# Patient Record
Sex: Male | Born: 1960 | Race: White | Hispanic: No | Marital: Single | Smoking: Current every day smoker
Health system: Southern US, Community
[De-identification: ages and names within clinical notes are randomized; demographics above are authoritative.]

## PROBLEM LIST (undated history)

## (undated) DIAGNOSIS — I219 Acute myocardial infarction, unspecified: Secondary | ICD-10-CM

## (undated) DIAGNOSIS — K219 Gastro-esophageal reflux disease without esophagitis: Secondary | ICD-10-CM

## (undated) DIAGNOSIS — Z8614 Personal history of Methicillin resistant Staphylococcus aureus infection: Secondary | ICD-10-CM

## (undated) DIAGNOSIS — J449 Chronic obstructive pulmonary disease, unspecified: Secondary | ICD-10-CM

## (undated) DIAGNOSIS — I10 Essential (primary) hypertension: Secondary | ICD-10-CM

## (undated) DIAGNOSIS — K429 Umbilical hernia without obstruction or gangrene: Secondary | ICD-10-CM

## (undated) HISTORY — PX: CORONARY ANGIOPLASTY: SHX604

## (undated) HISTORY — DX: Umbilical hernia without obstruction or gangrene: K42.9

## (undated) HISTORY — DX: Acute myocardial infarction, unspecified: I21.9

---

## 2012-04-21 HISTORY — PX: CORONARY STENT PLACEMENT: SHX1402

## 2018-01-13 ENCOUNTER — Encounter: Payer: Self-pay | Admitting: *Deleted

## 2018-01-19 ENCOUNTER — Encounter: Payer: Self-pay | Admitting: Surgery

## 2018-01-19 ENCOUNTER — Ambulatory Visit (INDEPENDENT_AMBULATORY_CARE_PROVIDER_SITE_OTHER): Payer: No Typology Code available for payment source | Admitting: Surgery

## 2018-01-19 VITALS — BP 122/66 | HR 65 | Temp 98.1°F | Ht 67.0 in | Wt 177.0 lb

## 2018-01-19 DIAGNOSIS — K429 Umbilical hernia without obstruction or gangrene: Secondary | ICD-10-CM | POA: Diagnosis not present

## 2018-01-19 HISTORY — DX: Umbilical hernia without obstruction or gangrene: K42.9

## 2018-01-19 NOTE — Progress Notes (Signed)
Surgical Clinic History and Physical  Referring provider:  Franciso Bend, NP [address] [place], [place] [address]  HISTORY OF PRESENT ILLNESS (HPI):  57 y.o. male presents for evaluation of his umbilical hernia. Patient reports he first noticed it approximately 1 year ago with surgical evaluation recently advised by patient's primary care physician. Patient denies any abdominal pain or discomfort and states his former constipation has resolved with once daily Metamucil. He underwent colonoscopy 2 years ago and was advised to have another in 10 years, denies blood with BM's, and denies straining or difficulty otherwise with urination. 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 Patient otherwise denies abdominal distention, N/V, unintentional weight loss, CP, or SOB.  PAST MEDICAL HISTORY (PMH):  Past Medical History:  Diagnosis Date  . Myocardial infarction (HCC)      PAST SURGICAL HISTORY (PSH):  Past Surgical History:  Procedure Laterality Date  . CORONARY STENT PLACEMENT  2014     MEDICATIONS:  Prior to Admission medications   Medication Sig Start Date End Date Taking? Authorizing Provider  albuterol (PROVENTIL HFA;VENTOLIN HFA) 108 (90 Base) MCG/ACT inhaler INHALE 1 TO 2 PUFFS INTO LUNGS EVERY 4 TO 6 HOURS AS NEEDED 12/25/17  Yes [provider]  ANORO ELLIPTA 62.5-25 MCG/INH AEPB INHALE 1 PUFF BY MOUTH ONCE DAILY 12/25/17  Yes [provider]  aspirin EC 81 MG tablet Take 81 mg by mouth daily.   Yes [provider]  carvedilol (COREG) 3.125 MG tablet Take 3.125 mg by mouth 2 (two) times daily. 12/25/17  Yes [provider]  D3-50 50000 units capsule Take 50,000 Units by mouth once a week. 12/29/17  Yes [provider]  lovastatin (MEVACOR) 20 MG tablet Take 20 mg by mouth daily. 12/25/17  Yes [provider]  omeprazole (PRILOSEC)  20 MG capsule Take 20 mg by mouth daily.   Yes [provider]     ALLERGIES:  Not on File   SOCIAL HISTORY:  Social History   Socioeconomic History  . Marital status: Unknown    Spouse name: Not on file  . Number of children: Not on file  . Years of education: Not on file  . Highest education level: Not on file  Occupational History  . Not on file  Social Needs  . Financial resource strain: Not on file  . Food insecurity:    Worry: Not on file    Inability: Not on file  . Transportation needs:    Medical: Not on file    Non-medical: Not on file  Tobacco Use  . Smoking status: Current Every Day Smoker    Packs/day: 1.00    Years: 40.00    Pack years: 40.00  . Smokeless tobacco: Never Used  Substance and Sexual Activity  . Alcohol use: Never    Frequency: Never  . Drug use: Never  . Sexual activity: Not on file  Lifestyle  . Physical activity:    Days per week: Not on file    Minutes per session: Not on file  . Stress: Not on file  Relationships  . Social connections:    Talks on phone: Not on file    Gets together: Not on file    Attends religious service: Not on file    Active member of club or organization: Not on file    Attends meetings of clubs or organizations: Not on file    Relationship  status: Not on file  . Intimate partner violence:    Fear of current or ex partner: Not on file    Emotionally abused: Not on file    Physically abused: Not on file    Forced sexual activity: Not on file  Other Topics Concern  . Not on file  Social History Narrative  . Not on file    The patient currently resides (home / rehab facility / nursing home): Home The patient normally is (ambulatory / bedbound): Ambulatory  FAMILY HISTORY:  Family History  Problem Relation Age of Onset  . Esophageal cancer Mother   . Kidney disease Mother   . Heart disease Father   . Prostate cancer Brother   . Prostate cancer Maternal Grandfather     Otherwise  negative/non-contributory.  REVIEW OF SYSTEMS:  Constitutional: denies any other weight loss, fever, chills, or sweats  Eyes: denies any other vision changes, history of eye injury  ENT: denies sore throat, hearing problems  Respiratory: denies shortness of breath, wheezing  Cardiovascular: denies chest pain, palpitations  Gastrointestinal: abdominal pain, N/V, and bowel function as per HPI Musculoskeletal: denies any other joint pains or cramps  Skin: Denies any other rashes or skin discolorations Neurological: denies any other headache, dizziness, weakness  Psychiatric: Denies any other depression, anxiety   All other review of systems were otherwise negative   VITAL SIGNS:  BP 122/66   Pulse 65   Temp 98.1 F (36.7 C) (Temporal)   Ht 5\' 7"  (1.702 m)   Wt 177 lb (80.3 kg)   BMI 27.72 kg/m   PHYSICAL EXAM:  Constitutional:  -- Normal body habitus  -- Awake, alert, and oriented x3  Eyes:  -- Pupils equally round and reactive to light  -- No scleral icterus  Ear, nose, throat:  -- No jugular venous distension -- No nasal drainage, bleeding Pulmonary:  -- No crackles  -- Equal breath sounds bilaterally -- Breathing non-labored at rest Cardiovascular:  -- S1, S2 present  -- No pericardial rubs  Gastrointestinal:  -- Abdomen soft, nontender, non-distended, no guarding/rebound  -- Easily reducible completely non-tender to palpation umbilical hernia -- No other abdominal masses appreciated, pulsatile or otherwise  Musculoskeletal and Integumentary:  -- Wounds or skin discoloration: None appreciated -- Extremities: B/L UE and LE FROM, hands and feet warm, no edema  Neurologic:  -- Motor function: Intact and symmetric -- Sensation: Intact and symmetric  Labs: CBC: No results found for: WBC, RBC BMP: No results found for: GLUCOSE, POTASSIUM, CHLORIDE, CO2, BUN, CREATININE, CALCIUM   Imaging studies: No new pertinent imaging studies available for review    Assessment/Plan: 57 y.o. male with asymptomatic easily reducible umbilical hernia, complicated by pertinent comorbidities including CAD s/p PCI with DES for MI and chronic ongoing tobacco abuse (smoking).               - discussed with patient signs and symptoms of hernia incarceration and obstruction             - strategies for manual self-reduction of patient's umbilical hernia also reviewed and discussed  - maintain hydration with high fiber heart healthy diet to reduce/minimize constipation +/- daily stool softener as needed             - all risks, benefits, and alternatives to open repair of umbilical hernia with mesh were discussed with the patient, all of his questions were answered to patient's expressed satisfaction, patient expresses he is not currently interested in having  his umbilical hernia repaired at this time.  - also discussed with patient wearing abdominal binder, particularly with heavy lifting at work             - return to clinic as needed, instructed to call if any questions or concerns  - smoking cessation discussed at length, strongly encouraged  Thank you for the opportunity to participate in this patient's care.  -- Scherrie Gerlach Earlene Plater, MD, RPVI [place]: [facility] General Surgery - Partnering for exceptional care. Office: [phone]

## 2018-01-19 NOTE — Patient Instructions (Signed)
Please call our office if you decide to move forward with surgery.    Umbilical Hernia, Adult A hernia is a bulge of tissue that pushes through an opening between muscles. An umbilical hernia happens in the abdomen, near the belly button (umbilicus). The hernia may contain tissues from the small intestine, large intestine, or fatty tissue covering the intestines (omentum). Umbilical hernias in adults tend to get worse over time, and they require surgical treatment. There are several types of umbilical hernias. You may have:  A hernia located just above or below the umbilicus (indirect hernia). This is the most common type of umbilical hernia in adults.  A hernia that forms through an opening formed by the umbilicus (direct hernia).  A hernia that comes and goes (reducible hernia). A reducible hernia may be visible only when you strain, lift something heavy, or cough. This type of hernia can be pushed back into the abdomen (reduced).  A hernia that traps abdominal tissue inside the hernia (incarcerated hernia). This type of hernia cannot be reduced.  A hernia that cuts off blood flow to the tissues inside the hernia (strangulated hernia). The tissues can start to die if this happens. This type of hernia requires emergency treatment.  What are the causes? An umbilical hernia happens when tissue inside the abdomen presses on a weak area of the abdominal muscles. What increases the risk? You may have a greater risk of this condition if you:  Are obese.  Have had several pregnancies.  Have a buildup of fluid inside your abdomen (ascites).  Have had surgery that weakens the abdominal muscles.  What are the signs or symptoms? The main symptom of this condition is a painless bulge at or near the belly button. A reducible hernia may be visible only when you strain, lift something heavy, or cough. Other symptoms may include:  Dull pain.  A feeling of pressure.  Symptoms of a strangulated  hernia may include:  Pain that gets increasingly worse.  Nausea and vomiting.  Pain when pressing on the hernia.  Skin over the hernia becoming red or purple.  Constipation.  Blood in the stool.  How is this diagnosed? This condition may be diagnosed based on:  A physical exam. You may be asked to cough or strain while standing. These actions increase the pressure inside your abdomen and force the hernia through the opening in your muscles. Your health care provider may try to reduce the hernia by pressing on it.  Your symptoms and medical history.  How is this treated? Surgery is the only treatment for an umbilical hernia. Surgery for a strangulated hernia is done as soon as possible. If you have a small hernia that is not incarcerated, you may need to lose weight before having surgery. Follow these instructions at home:  Lose weight, if told by your health care provider.  Do not try to push the hernia back in.  Watch your hernia for any changes in color or size. Tell your health care provider if any changes occur.  You may need to avoid activities that increase pressure on your hernia.  Do not lift anything that is heavier than 10 lb (4.5 kg) until your health care provider says that this is safe.  Take over-the-counter and prescription medicines only as told by your health care provider.  Keep all follow-up visits as told by your health care provider. This is important. Contact a health care provider if:  Your hernia gets larger.  Your hernia  becomes painful. Get help right away if:  You develop sudden, severe pain near the area of your hernia.  You have pain as well as nausea or vomiting.  You have pain and the skin over your hernia changes color.  You develop a fever. This information is not intended to replace advice given to you by your health care provider. Make sure you discuss any questions you have with your health care provider. Document Released:  09/07/2015 Document Revised: 12/09/2015 Document Reviewed: 09/07/2015 Elsevier Interactive Patient Education  2018 Elsevier Inc.        High-Fiber Diet Fiber, also called dietary fiber, is a type of carbohydrate found in fruits, vegetables, whole grains, and beans. A high-fiber diet can have many health benefits. Your health care provider may recommend a high-fiber diet to help:  Prevent constipation. Fiber can make your bowel movements more regular.  Lower your cholesterol.  Relieve hemorrhoids, uncomplicated diverticulosis, or irritable bowel syndrome.  Prevent overeating as part of a weight-loss plan.  Prevent heart disease, type 2 diabetes, and certain cancers.  What is my plan? The recommended daily intake of fiber includes:  38 grams for men under age 25.  30 grams for men over age 35.  25 grams for women under age 41.  21 grams for women over age 57.  You can get the recommended daily intake of dietary fiber by eating a variety of fruits, vegetables, grains, and beans. Your health care provider may also recommend a fiber supplement if it is not possible to get enough fiber through your diet. What do I need to know about a high-fiber diet?  Fiber supplements have not been widely studied for their effectiveness, so it is better to get fiber through food sources.  Always check the fiber content on thenutrition facts label of any prepackaged food. Look for foods that contain at least 5 grams of fiber per serving.  Ask your dietitian if you have questions about specific foods that are related to your condition, especially if those foods are not listed in the following section.  Increase your daily fiber consumption gradually. Increasing your intake of dietary fiber too quickly may cause bloating, cramping, or gas.  Drink plenty of water. Water helps you to digest fiber. What foods can I eat? Grains Whole-grain breads. Multigrain cereal. Oats and oatmeal. Brown rice.  Barley. Bulgur wheat. Millet. Bran muffins. Popcorn. Rye wafer crackers. Vegetables Sweet potatoes. Spinach. Kale. Artichokes. Cabbage. Broccoli. Green peas. Carrots. Squash. Fruits Berries. Pears. Apples. Oranges. Avocados. Prunes and raisins. Dried figs. Meats and Other Protein Sources Navy, kidney, pinto, and soy beans. Split peas. Lentils. Nuts and seeds. Dairy Fiber-fortified yogurt. Beverages Fiber-fortified soy milk. Fiber-fortified orange juice. Other Fiber bars. The items listed above may not be a complete list of recommended foods or beverages. Contact your dietitian for more options. What foods are not recommended? Grains White bread. Pasta made with refined flour. White rice. Vegetables Fried potatoes. Canned vegetables. Well-cooked vegetables. Fruits Fruit juice. Cooked, strained fruit. Meats and Other Protein Sources Fatty cuts of meat. Fried Environmental education officer or fried fish. Dairy Milk. Yogurt. Cream cheese. Sour cream. Beverages Soft drinks. Other Cakes and pastries. Butter and oils. The items listed above may not be a complete list of foods and beverages to avoid. Contact your dietitian for more information. What are some tips for including high-fiber foods in my diet?  Eat a wide variety of high-fiber foods.  Make sure that half of all grains consumed each day are whole  grains.  Replace breads and cereals made from refined flour or white flour with whole-grain breads and cereals.  Replace white rice with brown rice, bulgur wheat, or millet.  Start the day with a breakfast that is high in fiber, such as a cereal that contains at least 5 grams of fiber per serving.  Use beans in place of meat in soups, salads, or pasta.  Eat high-fiber snacks, such as berries, raw vegetables, nuts, or popcorn. This information is not intended to replace advice given to you by your health care provider. Make sure you discuss any questions you have with your health care  provider. Document Released: 04/07/2005 Document Revised: 09/13/2015 Document Reviewed: 09/20/2013 Elsevier Interactive Patient Education  2018 [employer]. Constipation, Adult Constipation is when a person:  Poops (has a bowel movement) fewer times in a week than normal.  Has a hard time pooping.  Has poop that is dry, hard, or bigger than normal.  Follow these instructions at home: Eating and drinking   Eat foods that have a lot of fiber, such as: ? Fresh fruits and vegetables. ? Whole grains. ? Beans.  Eat less of foods that are high in fat, low in fiber, or overly processed, such as: ? [place] fries. ? Hamburgers. ? Cookies. ? Candy. ? Soda.  Drink enough fluid to keep your pee (urine) clear or pale yellow. General instructions  Exercise regularly or as told by your doctor.  Go to the restroom when you feel like you need to poop. Do not hold it in.  Take over-the-counter and prescription medicines only as told by your doctor. These include any fiber supplements.  Do pelvic floor retraining exercises, such as: ? Doing deep breathing while relaxing your lower belly (abdomen). ? Relaxing your pelvic floor while pooping.  Watch your condition for any changes.  Keep all follow-up visits as told by your doctor. This is important. Contact a doctor if:  You have pain that gets worse.  You have a fever.  You have not pooped for 4 days.  You throw up (vomit).  You are not hungry.  You lose weight.  You are bleeding from the anus.  You have thin, pencil-like poop (stool). Get help right away if:  You have a fever, and your symptoms suddenly get worse.  You leak poop or have blood in your poop.  Your belly feels hard or bigger than normal (is bloated).  You have very bad belly pain.  You feel dizzy or you faint. This information is not intended to replace advice given to you by your health care provider. Make sure you discuss any questions you have  with your health care provider. Document Released: 09/24/2007 Document Revised: 10/26/2015 Document Reviewed: 09/26/2015 Elsevier Interactive Patient Education  2018 Elsevier Inc. Docusate capsules What is this medicine? DOCUSATE (doc CUE sayt) is stool softener. It helps prevent constipation and straining or discomfort associated with hard or dry stools. This medicine may be used for other purposes; ask your health care provider or pharmacist if you have questions. COMMON BRAND NAME(S): Colace, Colace Clear, Correctol, D.O.S., DC, Doc-Q-Lace, DocuLace, Docusoft S, DOK, DOK Extra Strength, Dulcolax, Genasoft, Kao-Tin, Kaopectate Liqui-Gels, Phillips Stool Softener, Stool Softener, Stool Softner DC, Sulfolax, Sur-Q-Lax, Surfak, Uni-Ease What should I tell my health care provider before I take this medicine? They need to know if you have any of these conditions: -nausea or vomiting -severe constipation -stomach pain -sudden change in bowel habit lasting more than 2 weeks -an unusual  or allergic reaction to docusate, other medicines, foods, dyes, or preservatives -pregnant or trying to get pregnant -breast-feeding How should I use this medicine? Take this medicine by mouth with a glass of water. Follow the directions on the label. Take your doses at regular intervals. Do not take your medicine more often than directed. Talk to your pediatrician regarding the use of this medicine in children. While this medicine may be prescribed for children as young as 2 years for selected conditions, precautions do apply. Overdosage: If you think you have taken too much of this medicine contact a poison control center or emergency room at once. NOTE: This medicine is only for you. Do not share this medicine with others. What if I miss a dose? If you miss a dose, take it as soon as you can. If it is almost time for your next dose, take only that dose. Do not take double or extra doses. What may interact with  this medicine? -mineral oil This list may not describe all possible interactions. Give your health care provider a list of all the medicines, herbs, non-prescription drugs, or dietary supplements you use. Also tell them if you smoke, drink alcohol, or use illegal drugs. Some items may interact with your medicine. What should I watch for while using this medicine? Do not use for more than one week without advice from your doctor or health care professional. If your constipation returns, check with your doctor or health care professional. Drink plenty of water while taking this medicine. Drinking water helps decrease constipation. Stop using this medicine and contact your doctor or health care professional if you experience any rectal bleeding or do not have a bowel movement after use. These could be signs of a more serious condition. What side effects may I notice from receiving this medicine? Side effects that you should report to your doctor or health care professional as soon as possible: -allergic reactions like skin rash, itching or hives, swelling of the face, lips, or tongue Side effects that usually do not require medical attention (report to your doctor or health care professional if they continue or are bothersome): -diarrhea -stomach cramps -throat irritation This list may not describe all possible side effects. Call your doctor for medical advice about side effects. You may report side effects to FDA at 1-800-FDA-1088. Where should I keep my medicine? Keep out of the reach of children. Store at room temperature between 15 and 30 degrees C (59 and 86 degrees F). Throw away any unused medicine after the expiration date. NOTE: This sheet is a summary. It may not cover all possible information. If you have questions about this medicine, talk to your doctor, pharmacist, or health care provider.  2018 Elsevier/Gold Standard (2007-07-29 15:56:49)

## 2018-01-27 ENCOUNTER — Emergency Department
Admission: EM | Admit: 2018-01-27 | Discharge: 2018-01-27 | Disposition: A | Discharge status: Home / Self Care | Payer: No Typology Code available for payment source | Attending: Student in an Organized Health Care Education/Training Program | Admitting: Student in an Organized Health Care Education/Training Program

## 2018-01-27 ENCOUNTER — Other Ambulatory Visit: Payer: Self-pay

## 2018-01-27 ENCOUNTER — Encounter: Payer: Self-pay | Admitting: *Deleted

## 2018-01-27 ENCOUNTER — Telehealth: Payer: Self-pay | Admitting: *Deleted

## 2018-01-27 DIAGNOSIS — F17210 Nicotine dependence, cigarettes, uncomplicated: Secondary | ICD-10-CM | POA: Insufficient documentation

## 2018-01-27 DIAGNOSIS — Z955 Presence of coronary angioplasty implant and graft: Secondary | ICD-10-CM | POA: Diagnosis not present

## 2018-01-27 DIAGNOSIS — R1033 Periumbilical pain: Secondary | ICD-10-CM | POA: Diagnosis present

## 2018-01-27 DIAGNOSIS — I252 Old myocardial infarction: Secondary | ICD-10-CM | POA: Diagnosis not present

## 2018-01-27 DIAGNOSIS — Z79899 Other long term (current) drug therapy: Secondary | ICD-10-CM | POA: Diagnosis not present

## 2018-01-27 DIAGNOSIS — K429 Umbilical hernia without obstruction or gangrene: Secondary | ICD-10-CM | POA: Insufficient documentation

## 2018-01-27 DIAGNOSIS — Z7982 Long term (current) use of aspirin: Secondary | ICD-10-CM | POA: Insufficient documentation

## 2018-01-27 LAB — COMPREHENSIVE METABOLIC PANEL
ALBUMIN: 4.2 g/dL (ref 3.5–5.0)
ALK PHOS: 71 U/L (ref 38–126)
ALT: 17 U/L (ref 0–44)
ANION GAP: 6 (ref 5–15)
AST: 18 U/L (ref 15–41)
BUN: 8 mg/dL (ref 6–20)
CHLORIDE: 103 mmol/L (ref 98–111)
CO2: 30 mmol/L (ref 22–32)
Calcium: 9.3 mg/dL (ref 8.9–10.3)
Creatinine, Ser: 0.86 mg/dL (ref 0.61–1.24)
GFR calc non Af Amer: >60 mL/min (ref >60)
GLUCOSE: 119 mg/dL — AB (ref 70–99)
Potassium: 4.7 mmol/L (ref 3.5–5.1)
SODIUM: 139 mmol/L (ref 135–145)
TOTAL PROTEIN: 7.4 g/dL (ref 6.5–8.1)
Total Bilirubin: 0.5 mg/dL (ref 0.3–1.2)

## 2018-01-27 LAB — URINALYSIS, COMPLETE (UACMP) WITH MICROSCOPIC
Bacteria, UA: NONE SEEN
Bilirubin Urine: NEGATIVE
GLUCOSE, UA: NEGATIVE mg/dL
HGB URINE DIPSTICK: NEGATIVE
KETONES UR: NEGATIVE mg/dL
LEUKOCYTES UA: NEGATIVE
NITRITE: NEGATIVE
PH: 5 (ref 5.0–8.0)
PROTEIN: NEGATIVE mg/dL
Specific Gravity, Urine: 1.017 (ref 1.005–1.030)

## 2018-01-27 LAB — CBC
HCT: 46.3 % (ref 39.0–52.0)
HEMOGLOBIN: 15.5 g/dL (ref 13.0–17.0)
MCH: 29.1 pg (ref 26.0–34.0)
MCHC: 33.5 g/dL (ref 30.0–36.0)
MCV: 87 fL (ref 80.0–100.0)
NRBC: 0 % (ref 0.0–0.2)
PLATELETS: 317 10*3/uL (ref 150–400)
RBC: 5.32 MIL/uL (ref 4.22–5.81)
RDW: 13.5 % (ref 11.5–15.5)
WBC: 9.3 10*3/uL (ref 4.0–10.5)

## 2018-01-27 LAB — LIPASE, BLOOD: LIPASE: 27 U/L (ref 11–51)

## 2018-01-27 NOTE — Telephone Encounter (Signed)
Spoke with patient at this time and he states the hernia is very hard, protruding and very painful. He is currently in route to the emergency room.

## 2018-01-27 NOTE — ED Triage Notes (Signed)
Pt has abd pain  Pt has an umbilical hernia for 1 year.  Pt saw a surgeon last week for the hernia and states he pushed it in and now i'm having pain.  No n/v.  Pt states pain began this am.  Pt alert.

## 2018-01-27 NOTE — ED Provider Notes (Signed)
[facility] Emergency Department Provider Note    First MD Initiated Contact with Patient 01/27/18 1631     (approximate)  I have reviewed the triage vital signs and the nursing notes.   HISTORY  Chief Complaint Abdominal Pain    HPI Mark Cuevas is a 57 y.o. male Modena Jansky with chief complaint of periumbilical pain that was mild to moderate in severity.  Started this morning after he ate.  States he does have a known history of intermittent periumbilical hernia.  Was seen by surgery just recently.  States he was afraid to press on it but while in the waiting room he turned to the left and had sudden resolution of his pain.  Looked down at his bellybutton and the bulge was gone.  No nausea or vomiting.  States he feels "fine" right now.    Past Medical History:  Diagnosis Date  . Myocardial infarction [facility])    Family History  Problem Relation Age of Onset  . Esophageal cancer Mother   . Kidney disease Mother   . Heart disease Father   . Prostate cancer Brother   . Prostate cancer Maternal Grandfather    Past Surgical History:  Procedure Laterality Date  . CORONARY STENT PLACEMENT  2014   Patient Active Problem List   Diagnosis Date Noted  . Umbilical hernia without obstruction and without gangrene 01/19/2018      Prior to Admission medications   Medication Sig Start Date End Date Taking? Authorizing Provider  albuterol (PROVENTIL HFA;VENTOLIN HFA) 108 (90 Base) MCG/ACT inhaler INHALE 1 TO 2 PUFFS INTO LUNGS EVERY 4 TO 6 HOURS AS NEEDED 12/25/17   [provider]  ANORO ELLIPTA 62.5-25 MCG/INH AEPB INHALE 1 PUFF BY MOUTH ONCE DAILY 12/25/17   [provider]  aspirin EC 81 MG tablet Take 81 mg by mouth daily.    [provider]  carvedilol (COREG) 3.125 MG tablet Take 3.125 mg by mouth 2 (two) times daily. 12/25/17   [provider]  D3-50 50000 units capsule Take 50,000 Units by mouth once a week. 12/29/17   [provider]  lovastatin (MEVACOR) 20 MG tablet Take 20 mg by mouth daily. 12/25/17   [provider]  omeprazole (PRILOSEC) 20 MG capsule Take 20 mg by mouth daily.    [provider]    Allergies Patient has no known allergies.    Social History Social History   Tobacco Use  . Smoking status: Current Every Day Smoker    Packs/day: 1.00    Years: 40.00    Pack years: 40.00  . Smokeless tobacco: Never Used  Substance Use Topics  . Alcohol use: Never    Frequency: Never  . Drug use: Never    Review of Systems Patient denies headaches, rhinorrhea, blurry vision, numbness, shortness of breath, chest pain, edema, cough, abdominal pain, nausea, vomiting, diarrhea, dysuria, fevers, rashes or hallucinations unless otherwise stated above in HPI. ____________________________________________   PHYSICAL EXAM:  VITAL SIGNS: Vitals:   01/27/18 1525 01/27/18 1527  BP:  137/75  Pulse: (!) 56   Resp: 20   Temp: 98.7 F (37.1 C)   SpO2: 100%     Constitutional: Alert and oriented.  Eyes: Conjunctivae are normal.  Head: Atraumatic. Nose: No congestion/rhinnorhea. Mouth/Throat: Mucous membranes are moist.   Neck: No stridor. Painless ROM.  Cardiovascular: Normal rate, regular rhythm. Grossly normal heart sounds.  Good peripheral circulation. Respiratory: Normal respiratory effort.  No retractions. Lungs CTAB.  Gastrointestinal: Soft and nontender.  Umbilical defect palpable with small 1 cm reducible hernia.  Mild tenderness but belly soft.  No distention. No abdominal bruits. No CVA tenderness. Genitourinary:  Musculoskeletal: No lower extremity tenderness nor edema.  No joint effusions. Neurologic:  Normal speech and language. No gross focal neurologic deficits are appreciated. No facial droop Skin:  Skin is warm, dry and intact. No rash noted. Psychiatric: Mood and affect are normal. Speech and behavior are  normal.  ____________________________________________   LABS (all labs ordered are listed, but only abnormal results are displayed)  Results for orders placed or performed during the hospital encounter of 01/27/18 (from the past 24 hour(s))  Lipase, blood     Status: None   Collection Time: 01/27/18  3:30 PM  Result Value Ref Range   Lipase 27 11 - 51 U/L  Comprehensive metabolic panel     Status: Abnormal   Collection Time: 01/27/18  3:30 PM  Result Value Ref Range   Sodium 139 135 - 145 mmol/L   Potassium 4.7 3.5 - 5.1 mmol/L   Chloride 103 98 - 111 mmol/L   CO2 30 22 - 32 mmol/L   Glucose, Bld 119 (H) 70 - 99 mg/dL   BUN 8 6 - 20 mg/dL   Creatinine, Ser 1.61 0.61 - 1.24 mg/dL   Calcium 9.3 8.9 - 09.6 mg/dL   Total Protein 7.4 6.5 - 8.1 g/dL   Albumin 4.2 3.5 - 5.0 g/dL   AST 18 15 - 41 U/L   ALT 17 0 - 44 U/L   Alkaline Phosphatase 71 38 - 126 U/L   Total Bilirubin 0.5 0.3 - 1.2 mg/dL   GFR calc non Af Amer >60 >60 mL/min   GFR calc Af Amer >60 >60 mL/min   Anion gap 6 5 - 15  CBC     Status: None   Collection Time: 01/27/18  3:30 PM  Result Value Ref Range   WBC 9.3 4.0 - 10.5 K/uL   RBC 5.32 4.22 - 5.81 MIL/uL   Hemoglobin 15.5 13.0 - 17.0 g/dL   HCT 04.5 40.9 - 81.1 %   MCV 87.0 80.0 - 100.0 fL   MCH 29.1 26.0 - 34.0 pg   MCHC 33.5 30.0 - 36.0 g/dL   RDW 91.4 78.2 - 95.6 %   Platelets 317 150 - 400 K/uL   nRBC 0.0 0.0 - 0.2 %  Urinalysis, Complete w Microscopic     Status: Abnormal   Collection Time: 01/27/18  3:30 PM  Result Value Ref Range   Color, Urine YELLOW (A) YELLOW   APPearance CLEAR (A) CLEAR   Specific Gravity, Urine 1.017 1.005 - 1.030   pH 5.0 5.0 - 8.0   Glucose, UA NEGATIVE NEGATIVE mg/dL   Hgb urine dipstick NEGATIVE NEGATIVE   Bilirubin Urine NEGATIVE NEGATIVE   Ketones, ur NEGATIVE NEGATIVE mg/dL   Protein, ur NEGATIVE NEGATIVE mg/dL   Nitrite NEGATIVE NEGATIVE   Leukocytes, UA NEGATIVE NEGATIVE   RBC / HPF 0-5 0 - 5 RBC/hpf   WBC,  UA 0-5 0 - 5 WBC/hpf   Bacteria, UA NONE SEEN NONE SEEN   Squamous Epithelial / LPF 0-5 0 - 5   Mucus PRESENT    ____________________________________________ ED ECG REPORT I, Willy Eddy, the attending physician, personally viewed and interpreted this ECG.   Date: 01/27/2018  EKG Time: 15:32  Rate: 75  Rhythm: sinus  Axis: normal  Intervals:none  ST&T Change: no stemi  ____________________________________________  RADIOLOGY   ____________________________________________   PROCEDURES  Procedure(s) performed:  Procedures    Critical Care performed: no ____________________________________________   INITIAL IMPRESSION / ASSESSMENT AND PLAN / ED COURSE  Pertinent labs & imaging results that were available during my care of the patient were reviewed by me and considered in my medical decision making (see chart for details).   DDX: Strangulate hernia, incarcerated hernia, reducible hernia  Mark Cuevas is a 57 y.o. who presents to the ED with umbilical hernia status post spontaneous reduction.  His abdominal exam is soft benign.  Blood work is reassuring.  Patient stable and appropriate for outpatient follow-up.      As part of my medical decision making, I reviewed the following data within the electronic MEDICAL RECORD NUMBER Nursing notes reviewed and incorporated, Labs reviewed, notes from prior ED visits and  Controlled Substance Database   ____________________________________________   FINAL CLINICAL IMPRESSION(S) / ED DIAGNOSES  Final diagnoses:  Umbilical hernia without obstruction and without gangrene      NEW MEDICATIONS STARTED DURING THIS VISIT:  New Prescriptions   No medications on file     Note:  This document was prepared using Dragon voice recognition software and may include unintentional dictation errors.    Willy Eddy, MD 01/27/18 [phone]

## 2018-01-27 NOTE — Telephone Encounter (Signed)
Patient called and stated that he was here and saw Dr.Davis for his umbilical hernia. He is now complaining of pain and the pain his causing him to not be able to walk great. He wants to know should he proceed with surgery since its now bothering him

## 2018-02-04 ENCOUNTER — Encounter: Payer: Self-pay | Admitting: Surgery

## 2018-02-04 ENCOUNTER — Ambulatory Visit (INDEPENDENT_AMBULATORY_CARE_PROVIDER_SITE_OTHER): Payer: No Typology Code available for payment source | Admitting: Surgery

## 2018-02-04 ENCOUNTER — Other Ambulatory Visit: Payer: Self-pay

## 2018-02-04 ENCOUNTER — Encounter: Payer: Self-pay | Admitting: *Deleted

## 2018-02-04 VITALS — BP 116/73 | HR 66 | Temp 97.5°F | Resp 16 | Ht 67.0 in | Wt 178.0 lb

## 2018-02-04 DIAGNOSIS — K429 Umbilical hernia without obstruction or gangrene: Secondary | ICD-10-CM | POA: Diagnosis not present

## 2018-02-04 NOTE — Patient Instructions (Signed)
You have requested for your Umbilical Hernia be repaired. This has been scheduled on ____________________ by Dr. Earlene Plater at [facility].  Please see your (blue)pre-care sheet for information.  You will need to arrange to be off work for 1-2 weeks but will have to have a lifting restriction of no more than 15 lbs for 6 weeks following your surgery.   Umbilical Hernia, Adult A hernia is a bulge of tissue that pushes through an opening between muscles. An umbilical hernia happens in the abdomen, near the belly button (umbilicus). The hernia may contain tissues from the small intestine, large intestine, or fatty tissue covering the intestines (omentum). Umbilical hernias in adults tend to get worse over time, and they require surgical treatment. There are several types of umbilical hernias. You may have:  A hernia located just above or below the umbilicus (indirect hernia). This is the most common type of umbilical hernia in adults.  A hernia that forms through an opening formed by the umbilicus (direct hernia).  A hernia that comes and goes (reducible hernia). A reducible hernia may be visible only when you strain, lift something heavy, or cough. This type of hernia can be pushed back into the abdomen (reduced).  A hernia that traps abdominal tissue inside the hernia (incarcerated hernia). This type of hernia cannot be reduced.  A hernia that cuts off blood flow to the tissues inside the hernia (strangulated hernia). The tissues can start to die if this happens. This type of hernia requires emergency treatment.  What are the causes? An umbilical hernia happens when tissue inside the abdomen presses on a weak area of the abdominal muscles. What increases the risk? You may have a greater risk of this condition if you:  Are obese.  Have had several pregnancies.  Have a buildup of fluid inside your abdomen (ascites).  Have had surgery that weakens the abdominal muscles.  What are the  signs or symptoms? The main symptom of this condition is a painless bulge at or near the belly button. A reducible hernia may be visible only when you strain, lift something heavy, or cough. Other symptoms may include:  Dull pain.  A feeling of pressure.  Symptoms of a strangulated hernia may include:  Pain that gets increasingly worse.  Nausea and vomiting.  Pain when pressing on the hernia.  Skin over the hernia becoming red or purple.  Constipation.  Blood in the stool.  How is this diagnosed? This condition may be diagnosed based on:  A physical exam. You may be asked to cough or strain while standing. These actions increase the pressure inside your abdomen and force the hernia through the opening in your muscles. Your health care provider may try to reduce the hernia by pressing on it.  Your symptoms and medical history.  How is this treated? Surgery is the only treatment for an umbilical hernia. Surgery for a strangulated hernia is done as soon as possible. If you have a small hernia that is not incarcerated, you may need to lose weight before having surgery. Follow these instructions at home:  Lose weight, if told by your health care provider.  Do not try to push the hernia back in.  Watch your hernia for any changes in color or size. Tell your health care provider if any changes occur.  You may need to avoid activities that increase pressure on your hernia.  Do not lift anything that is heavier than 10 lb (4.5 kg) until your health care  provider says that this is safe.  Take over-the-counter and prescription medicines only as told by your health care provider.  Keep all follow-up visits as told by your health care provider. This is important. Contact a health care provider if:  Your hernia gets larger.  Your hernia becomes painful. Get help right away if:  You develop sudden, severe pain near the area of your hernia.  You have pain as well as nausea or  vomiting.  You have pain and the skin over your hernia changes color.  You develop a fever. This information is not intended to replace advice given to you by your health care provider. Make sure you discuss any questions you have with your health care provider. Document Released: 09/07/2015 Document Revised: 12/09/2015 Document Reviewed: 09/07/2015 Elsevier Interactive Patient Education  [employer].

## 2018-02-04 NOTE — H&P (View-Only) (Signed)
Surgical Clinic Progress/Follow-up Note   HPI:  57 y.o. Male presents to clinic for follow-up 2 weeks following his recent initial consultation for his umbilical hernia, at which time repair was offered, but declined due to limited symptoms associated with patient's umbilical hernia at that time. Since then, patient presented to [facility] ED 1 week ago for acute onset of umbilical abdominal pain, at which time patient describes he was not able to self-reduce his hernia and presented to [facility] ED accordingly. While in the ED, patient states (confirmed by EMR) that his hernia reduced spontaneously with change in position. Again discussed was patient's constipation, for which he takes only Metamucil without stool softener for hard stools requiring straining. Though patient continues to smoke despite prior MI requiring placement of DES (no longer taking Plavix), patient denies CP or SOB, even with frequent ascending/descending multiple flights of steps per day with much ambulation and similar activities. He also denies N/V.  Review of Systems:  Constitutional: denies any other weight loss, fever, chills, or sweats  Eyes: denies any other vision changes, history of eye injury  ENT: denies sore throat, hearing problems  Respiratory: denies shortness of breath, wheezing  Cardiovascular: denies chest pain, palpitations  Gastrointestinal: abdominal pain, N/V, and bowel function as per HPI Musculoskeletal: denies any other joint pains or cramps  Skin: Denies any other rashes or skin discolorations  Neurological: denies any other headache, dizziness, weakness  Psychiatric: denies any other depression, anxiety  All other review of systems: otherwise negative   Vital Signs:  BP 116/73   Pulse 66   Temp (!) 97.5 F (36.4 C) (Skin)   Resp 16   Ht 5\' 7"  (1.702 m)   Wt 178 lb (80.7 kg)   SpO2 94%   BMI 27.88 kg/m    Physical Exam:  Constitutional:  -- Normal body habitus  -- Awake, alert, and oriented  x3  Eyes:  -- Pupils equally round and reactive to light  -- No scleral icterus  Ear, nose, throat:  -- No jugular venous distension  -- No nasal drainage, bleeding Pulmonary:  -- No crackles -- Equal breath sounds bilaterally -- Breathing non-labored at rest Cardiovascular:  -- S1, S2 present  -- No pericardial rubs  Gastrointestinal:  -- Soft and non-distended with mild-umbilical tenderness to palpation, no guarding/rebound tenderness -- Reducible umbilical hernia with no other abdominal masses appreciated, pulsatile or otherwise  Musculoskeletal / Integumentary:  -- Wounds or skin discoloration: None appreciated  -- Extremities: B/L UE and LE FROM, hands and feet warm, no edema  Neurologic:  -- Motor function: intact and symmetric  -- Sensation: intact and symmetric   Laboratory studies:  CBC Latest Ref Rng & Units 01/27/2018  WBC 4.0 - 10.5 K/uL 9.3  Hemoglobin 13.0 - 17.0 g/dL 08.6  Hematocrit 57.8 - 52.0 % 46.3  Platelets 150 - 400 K/uL 317   CMP Latest Ref Rng & Units 01/27/2018  Glucose 70 - 99 mg/dL 469(G)  BUN 6 - 20 mg/dL 8  Creatinine 2.95 - 2.84 mg/dL 1.32  Sodium 440 - 102 mmol/L 139  Potassium 3.5 - 5.1 mmol/L 4.7  Chloride 98 - 111 mmol/L 103  CO2 22 - 32 mmol/L 30  Calcium 8.9 - 10.3 mg/dL 9.3  Total Protein 6.5 - 8.1 g/dL 7.4  Total Bilirubin 0.3 - 1.2 mg/dL 0.5  Alkaline Phos 38 - 126 U/L 71  AST 15 - 41 U/L 18  ALT 0 - 44 U/L 17   Imaging: No new  pertinent imaging studies available for review   Assessment/Plan: 57 y.o. malewith asymptomatic easily reducible umbilical hernia, complicated by pertinent comorbidities including CAD s/p PCI with DES for MI and chronic ongoing tobacco abuse (smoking).  - again discussed with patient signs and symptoms of hernia incarceration and obstruction - strategies for manual self-reduction of patient's umbilical hernia were also again reviewed and discussed             - add once to twice  daily Colace stool softener to ongoing reported daily Metamucil with adequate hydration and high fiber heart healthy diet to reduce/minimize constipation - all risks, benefits, alternatives to, and anticipated recovery followingopen repair of umbilical hernia with meshwere discussed with patient, all his questions were answered to his expressed satisfaction, patient expresses he wishes to proceed, and informed consent was obtained.             - abdominal binder may provide relief and reduce protrusion of hernia until repair, particularly with heavy lifting at work - return to clinicas needed, instructed to call if any questions or concerns  - anticipate return to clinic 2 weeks following above planned procedure             - smoking cessation discussed at length, strongly encouraged  - instructed to call office if any questions or concerns  Thank you for the opportunity to participate in this patient's care.  -- Scherrie Gerlach Earlene Plater, MD, RPVI [place]: [facility] General Surgery - Partnering for exceptional care. Office: [phone]

## 2018-02-04 NOTE — Progress Notes (Signed)
Patient will require medical clearance per Dr. Earlene Plater before umbilical hernia repair.  Medical clearance request has been faxed to Rexene Agent, NP.

## 2018-02-04 NOTE — Progress Notes (Signed)
Surgical Clinic Progress/Follow-up Note   HPI:  56 y.o. Male presents to clinic for follow-up 2 weeks following his recent initial consultation for his umbilical hernia, at which time repair was offered, but declined due to limited symptoms associated with patient's umbilical hernia at that time. Since then, patient presented to [facility] ED 1 week ago for acute onset of umbilical abdominal pain, at which time patient describes he was not able to self-reduce his hernia and presented to [facility] ED accordingly. While in the ED, patient states (confirmed by EMR) that his hernia reduced spontaneously with change in position. Again discussed was patient's constipation, for which he takes only Metamucil without stool softener for hard stools requiring straining. Though patient continues to smoke despite prior MI requiring placement of DES (no longer taking Plavix), patient denies CP or SOB, even with frequent ascending/descending multiple flights of steps per day with much ambulation and similar activities. He also denies N/V.  Review of Systems:  Constitutional: denies any other weight loss, fever, chills, or sweats  Eyes: denies any other vision changes, history of eye injury  ENT: denies sore throat, hearing problems  Respiratory: denies shortness of breath, wheezing  Cardiovascular: denies chest pain, palpitations  Gastrointestinal: abdominal pain, N/V, and bowel function as per HPI Musculoskeletal: denies any other joint pains or cramps  Skin: Denies any other rashes or skin discolorations  Neurological: denies any other headache, dizziness, weakness  Psychiatric: denies any other depression, anxiety  All other review of systems: otherwise negative   Vital Signs:  BP 116/73   Pulse 66   Temp (!) 97.5 F (36.4 C) (Skin)   Resp 16   Ht 5' 7" (1.702 m)   Wt 178 lb (80.7 kg)   SpO2 94%   BMI 27.88 kg/m    Physical Exam:  Constitutional:  -- Normal body habitus  -- Awake, alert, and oriented  x3  Eyes:  -- Pupils equally round and reactive to light  -- No scleral icterus  Ear, nose, throat:  -- No jugular venous distension  -- No nasal drainage, bleeding Pulmonary:  -- No crackles -- Equal breath sounds bilaterally -- Breathing non-labored at rest Cardiovascular:  -- S1, S2 present  -- No pericardial rubs  Gastrointestinal:  -- Soft and non-distended with mild-umbilical tenderness to palpation, no guarding/rebound tenderness -- Reducible umbilical hernia with no other abdominal masses appreciated, pulsatile or otherwise  Musculoskeletal / Integumentary:  -- Wounds or skin discoloration: None appreciated  -- Extremities: B/L UE and LE FROM, hands and feet warm, no edema  Neurologic:  -- Motor function: intact and symmetric  -- Sensation: intact and symmetric   Laboratory studies:  CBC Latest Ref Rng & Units 01/27/2018  WBC 4.0 - 10.5 K/uL 9.3  Hemoglobin 13.0 - 17.0 g/dL 15.5  Hematocrit 39.0 - 52.0 % 46.3  Platelets 150 - 400 K/uL 317   CMP Latest Ref Rng & Units 01/27/2018  Glucose 70 - 99 mg/dL 119(H)  BUN 6 - 20 mg/dL 8  Creatinine 0.61 - 1.24 mg/dL 0.86  Sodium 135 - 145 mmol/L 139  Potassium 3.5 - 5.1 mmol/L 4.7  Chloride 98 - 111 mmol/L 103  CO2 22 - 32 mmol/L 30  Calcium 8.9 - 10.3 mg/dL 9.3  Total Protein 6.5 - 8.1 g/dL 7.4  Total Bilirubin 0.3 - 1.2 mg/dL 0.5  Alkaline Phos 38 - 126 U/L 71  AST 15 - 41 U/L 18  ALT 0 - 44 U/L 17   Imaging: No new   pertinent imaging studies available for review   Assessment/Plan: 56 y.o. malewith asymptomatic easily reducible umbilical hernia, complicated by pertinent comorbidities including CAD s/p PCI with DES for MI and chronic ongoing tobacco abuse (smoking).  - again discussed with patient signs and symptoms of hernia incarceration and obstruction - strategies for manual self-reduction of patient's umbilical hernia were also again reviewed and discussed             - add once to twice  daily Colace stool softener to ongoing reported daily Metamucil with adequate hydration and high fiber heart healthy diet to reduce/minimize constipation - all risks, benefits, alternatives to, and anticipated recovery followingopen repair of umbilical hernia with meshwere discussed with patient, all his questions were answered to his expressed satisfaction, patient expresses he wishes to proceed, and informed consent was obtained.             - abdominal binder may provide relief and reduce protrusion of hernia until repair, particularly with heavy lifting at work - return to clinicas needed, instructed to call if any questions or concerns  - anticipate return to clinic 2 weeks following above planned procedure             - smoking cessation discussed at length, strongly encouraged  - instructed to call office if any questions or concerns  Thank you for the opportunity to participate in this patient's care.  -- Jason E. Davis, MD, RPVI [place]: [facility] General Surgery - Partnering for exceptional care. Office: [phone] 

## 2018-02-05 ENCOUNTER — Encounter: Payer: Self-pay | Admitting: Surgery

## 2018-02-17 ENCOUNTER — Telehealth: Payer: Self-pay | Admitting: *Deleted

## 2018-02-17 NOTE — Telephone Encounter (Signed)
Patient was contacted today to let him know that we received medical clearance from Rexene Agent, NP.   Patient's surgery to be scheduled for 02-26-18 at [facility] with Dr. Earlene Plater. It is okay for patient to continue an 81 mg aspirin once daily.   The patient is aware to call the office should they have further questions.

## 2018-02-23 ENCOUNTER — Other Ambulatory Visit: Payer: Self-pay

## 2018-02-23 ENCOUNTER — Ambulatory Visit
Admission: RE | Admit: 2018-02-23 | Discharge: 2018-02-23 | Disposition: A | Discharge status: Home / Self Care | Payer: No Typology Code available for payment source | Source: Ambulatory Visit | Attending: Surgery | Admitting: Surgery

## 2018-02-23 ENCOUNTER — Encounter
Admission: RE | Admit: 2018-02-23 | Discharge: 2018-02-23 | Disposition: A | Discharge status: Home / Self Care | Payer: No Typology Code available for payment source | Source: Ambulatory Visit | Attending: Surgery | Admitting: Surgery

## 2018-02-23 DIAGNOSIS — Z01811 Encounter for preprocedural respiratory examination: Secondary | ICD-10-CM | POA: Insufficient documentation

## 2018-02-23 DIAGNOSIS — K429 Umbilical hernia without obstruction or gangrene: Secondary | ICD-10-CM | POA: Diagnosis not present

## 2018-02-23 DIAGNOSIS — Z01818 Encounter for other preprocedural examination: Secondary | ICD-10-CM | POA: Diagnosis present

## 2018-02-23 HISTORY — DX: Chronic obstructive pulmonary disease, unspecified: J44.9

## 2018-02-23 HISTORY — DX: Essential (primary) hypertension: I10

## 2018-02-23 HISTORY — DX: Personal history of Methicillin resistant Staphylococcus aureus infection: Z86.14

## 2018-02-23 HISTORY — DX: Gastro-esophageal reflux disease without esophagitis: K21.9

## 2018-02-23 NOTE — Patient Instructions (Signed)
Your procedure is scheduled on: Friday 02/26/18 Report to Day Surgery. To find out your arrival time please call [phone] between 1PM - 3PM on thurs. 11/7.  Remember: Instructions that are not followed completely may result in serious medical risk,  up to and including death, or upon the discretion of your surgeon and anesthesiologist your  surgery may need to be rescheduled.     _X__ 1. Do not eat food after midnight the night before your procedure.                 No gum chewing or hard candies. You may drink clear liquids up to 2 hours                 before you are scheduled to arrive for your surgery- DO not drink clear                 liquids within 2 hours of the start of your surgery.                 Clear Liquids include:  water, apple juice without pulp, clear carbohydrate                 drink such as Clearfast of Gatorade, Black Coffee or Tea (Do not add                 anything to coffee or tea).  __X__2.  On the morning of surgery brush your teeth with toothpaste and water, you                may rinse your mouth with mouthwash if you wish.  Do not swallow any toothpaste of mouthwash.     _X__ 3.  No Alcohol for 24 hours before or after surgery.   ___ 4.  Do Not Smoke or use e-cigarettes For 24 Hours Prior to Your Surgery.                 Do not use any chewable tobacco products for at least 6 hours prior to                 surgery.  ____  5.  Bring all medications with you on the day of surgery if instructed.   _x___  6.  Notify your doctor if there is any change in your medical condition      (cold, fever, infections).     Do not wear jewelry, make-up, hairpins, clips or nail polish. Do not wear lotions, powders, or perfumes. You may wear deodorant. Do not shave 48 hours prior to surgery. Men may shave face and neck. Do not bring valuables to the hospital.    [facility] is not responsible for any belongings or valuables.  Contacts,  dentures or bridgework may not be worn into surgery. Leave your suitcase in the car. After surgery it may be brought to your room. For patients admitted to the hospital, discharge time is determined by your treatment team.   Patients discharged the day of surgery will not be allowed to drive home.   Please read over the following fact sheets that you were given:    __x__ Take these medicines the morning of surgery with A SIP OF WATER:    1. carvedilol (COREG) 3.125 MG tablet  2. lovastatin (MEVACOR) 20 MG tablet  3. omeprazole (PRILOSEC) 20 MG capsule take extra dose the night before and one the morning of surgery  4.  5.  6.  ____ Fleet Enema (as directed)   __x__ Use CHG Soap as directed  __x__ Use inhalers on the day of surgeryalbuterol (PROVENTIL HFA;VENTOLIN HFA) 108 (90 Base) MCG/ACT inhaler and bring with you            ANORO ELLIPTA 62.5-25 MCG/INH AEPB ____ Stop metformin 2 days prior to surgery    ____ Take 1/2 of usual insulin dose the night before surgery. No insulin the morning          of surgery.   __x__ Stop aspirin today  __x__ Stop Anti-inflammatories No IBUprofeno or Aleve.  Ok to take Tylenol   ____ Stop supplements until after surgery.    ____ Bring C-Pap to the hospital.

## 2018-02-25 MED ORDER — CEFAZOLIN SODIUM-DEXTROSE 2-4 GM/100ML-% IV SOLN
2.0000 g | INTRAVENOUS | Status: AC
  Administered 2018-02-26: 2 g via INTRAVENOUS

## 2018-02-26 ENCOUNTER — Ambulatory Visit
Admission: RE | Admit: 2018-02-26 | Discharge: 2018-02-26 | Disposition: A | Discharge status: Home / Self Care | Payer: No Typology Code available for payment source | Source: Ambulatory Visit | Attending: Surgery | Admitting: Surgery

## 2018-02-26 ENCOUNTER — Ambulatory Visit: Payer: No Typology Code available for payment source | Admitting: Registered Nurse

## 2018-02-26 ENCOUNTER — Encounter
Admission: RE | Disposition: A | Discharge status: Home / Self Care | Payer: Self-pay | Source: Ambulatory Visit | Attending: Surgery

## 2018-02-26 ENCOUNTER — Other Ambulatory Visit: Payer: Self-pay

## 2018-02-26 ENCOUNTER — Encounter: Payer: Self-pay | Admitting: *Deleted

## 2018-02-26 DIAGNOSIS — F17200 Nicotine dependence, unspecified, uncomplicated: Secondary | ICD-10-CM | POA: Diagnosis not present

## 2018-02-26 DIAGNOSIS — K429 Umbilical hernia without obstruction or gangrene: Secondary | ICD-10-CM | POA: Diagnosis not present

## 2018-02-26 DIAGNOSIS — I2510 Atherosclerotic heart disease of native coronary artery without angina pectoris: Secondary | ICD-10-CM | POA: Diagnosis not present

## 2018-02-26 DIAGNOSIS — K5900 Constipation, unspecified: Secondary | ICD-10-CM | POA: Diagnosis not present

## 2018-02-26 DIAGNOSIS — I10 Essential (primary) hypertension: Secondary | ICD-10-CM | POA: Diagnosis not present

## 2018-02-26 DIAGNOSIS — I252 Old myocardial infarction: Secondary | ICD-10-CM | POA: Insufficient documentation

## 2018-02-26 DIAGNOSIS — K420 Umbilical hernia with obstruction, without gangrene: Secondary | ICD-10-CM | POA: Insufficient documentation

## 2018-02-26 DIAGNOSIS — Z79899 Other long term (current) drug therapy: Secondary | ICD-10-CM | POA: Diagnosis not present

## 2018-02-26 DIAGNOSIS — Z955 Presence of coronary angioplasty implant and graft: Secondary | ICD-10-CM | POA: Diagnosis not present

## 2018-02-26 HISTORY — PX: UMBILICAL HERNIA REPAIR: SHX196

## 2018-02-26 SURGERY — REPAIR, HERNIA, UMBILICAL, ADULT
Anesthesia: General

## 2018-02-26 MED ORDER — SUGAMMADEX SODIUM 200 MG/2ML IV SOLN
INTRAVENOUS | Status: AC
  Filled 2018-02-26: qty 2

## 2018-02-26 MED ORDER — PROMETHAZINE HCL 25 MG/ML IJ SOLN: 6.2500 mg | INTRAMUSCULAR | Status: DC | PRN

## 2018-02-26 MED ORDER — SUCCINYLCHOLINE CHLORIDE 20 MG/ML IJ SOLN
INTRAMUSCULAR | Status: AC
  Filled 2018-02-26: qty 1

## 2018-02-26 MED ORDER — CHLORHEXIDINE GLUCONATE CLOTH 2 % EX PADS: 6.0000 | MEDICATED_PAD | Freq: Once | CUTANEOUS | Status: DC

## 2018-02-26 MED ORDER — MIDAZOLAM HCL 2 MG/2ML IJ SOLN
INTRAMUSCULAR | Status: DC | PRN
  Administered 2018-02-26: 2 mg via INTRAVENOUS

## 2018-02-26 MED ORDER — ONDANSETRON HCL 4 MG/2ML IJ SOLN
INTRAMUSCULAR | Status: DC | PRN
  Administered 2018-02-26: 4 mg via INTRAVENOUS

## 2018-02-26 MED ORDER — PROPOFOL 10 MG/ML IV BOLUS
INTRAVENOUS | Status: AC
  Filled 2018-02-26: qty 40

## 2018-02-26 MED ORDER — FENTANYL CITRATE (PF) 100 MCG/2ML IJ SOLN
INTRAMUSCULAR | Status: DC | PRN
  Administered 2018-02-26: 100 ug via INTRAVENOUS
  Administered 2018-02-26: 50 ug via INTRAVENOUS

## 2018-02-26 MED ORDER — ACETAMINOPHEN 500 MG PO TABS
ORAL_TABLET | ORAL | Status: AC
  Filled 2018-02-26: qty 2

## 2018-02-26 MED ORDER — ROCURONIUM BROMIDE 50 MG/5ML IV SOLN
INTRAVENOUS | Status: AC
  Filled 2018-02-26: qty 1

## 2018-02-26 MED ORDER — FENTANYL CITRATE (PF) 100 MCG/2ML IJ SOLN: 25.0000 ug | INTRAMUSCULAR | Status: DC | PRN

## 2018-02-26 MED ORDER — EPHEDRINE SULFATE 50 MG/ML IJ SOLN
INTRAMUSCULAR | Status: DC | PRN
  Administered 2018-02-26: 10 mg via INTRAVENOUS

## 2018-02-26 MED ORDER — DEXAMETHASONE SODIUM PHOSPHATE 10 MG/ML IJ SOLN
INTRAMUSCULAR | Status: DC | PRN
  Administered 2018-02-26: 10 mg via INTRAVENOUS

## 2018-02-26 MED ORDER — ONDANSETRON HCL 4 MG/2ML IJ SOLN
INTRAMUSCULAR | Status: AC
  Filled 2018-02-26: qty 2

## 2018-02-26 MED ORDER — FENTANYL CITRATE (PF) 100 MCG/2ML IJ SOLN
INTRAMUSCULAR | Status: AC
  Filled 2018-02-26: qty 2

## 2018-02-26 MED ORDER — LIDOCAINE HCL (PF) 1 % IJ SOLN
INTRAMUSCULAR | Status: AC
  Filled 2018-02-26: qty 30

## 2018-02-26 MED ORDER — MIDAZOLAM HCL 2 MG/2ML IJ SOLN
INTRAMUSCULAR | Status: AC
  Filled 2018-02-26: qty 2

## 2018-02-26 MED ORDER — SUGAMMADEX SODIUM 200 MG/2ML IV SOLN
INTRAVENOUS | Status: DC | PRN
  Administered 2018-02-26: 200 mg via INTRAVENOUS

## 2018-02-26 MED ORDER — ACETAMINOPHEN 500 MG PO TABS
1000.0000 mg | ORAL_TABLET | ORAL | Status: AC
  Administered 2018-02-26: 1000 mg via ORAL

## 2018-02-26 MED ORDER — BUPIVACAINE HCL (PF) 0.5 % IJ SOLN
INTRAMUSCULAR | Status: AC
  Filled 2018-02-26: qty 30

## 2018-02-26 MED ORDER — PROPOFOL 10 MG/ML IV BOLUS
INTRAVENOUS | Status: DC | PRN
  Administered 2018-02-26: 180 mg via INTRAVENOUS

## 2018-02-26 MED ORDER — GLYCOPYRROLATE 0.2 MG/ML IJ SOLN
INTRAMUSCULAR | Status: AC
  Filled 2018-02-26: qty 2

## 2018-02-26 MED ORDER — EPHEDRINE SULFATE 50 MG/ML IJ SOLN
INTRAMUSCULAR | Status: AC
  Filled 2018-02-26: qty 1

## 2018-02-26 MED ORDER — PHENYLEPHRINE HCL 10 MG/ML IJ SOLN
INTRAMUSCULAR | Status: AC
  Filled 2018-02-26: qty 1

## 2018-02-26 MED ORDER — OXYCODONE-ACETAMINOPHEN 5-325 MG PO TABS: 1.0000 | ORAL_TABLET | ORAL | 0 refills | Status: DC | PRN

## 2018-02-26 MED ORDER — LIDOCAINE HCL 1 % IJ SOLN
INTRAMUSCULAR | Status: DC | PRN
  Administered 2018-02-26: 20 mL via INTRAMUSCULAR

## 2018-02-26 MED ORDER — ROCURONIUM BROMIDE 100 MG/10ML IV SOLN
INTRAVENOUS | Status: DC | PRN
  Administered 2018-02-26: 50 mg via INTRAVENOUS

## 2018-02-26 MED ORDER — CEFAZOLIN SODIUM-DEXTROSE 2-4 GM/100ML-% IV SOLN
INTRAVENOUS | Status: AC
  Filled 2018-02-26: qty 100

## 2018-02-26 MED ORDER — LACTATED RINGERS IV SOLN
INTRAVENOUS | Status: DC
  Administered 2018-02-26 (×2): via INTRAVENOUS

## 2018-02-26 MED ORDER — LIDOCAINE HCL (PF) 2 % IJ SOLN
INTRAMUSCULAR | Status: AC
  Filled 2018-02-26: qty 10

## 2018-02-26 MED ORDER — DEXAMETHASONE SODIUM PHOSPHATE 10 MG/ML IJ SOLN
INTRAMUSCULAR | Status: AC
  Filled 2018-02-26: qty 1

## 2018-02-26 MED ORDER — LIDOCAINE HCL (CARDIAC) PF 100 MG/5ML IV SOSY
PREFILLED_SYRINGE | INTRAVENOUS | Status: DC | PRN
  Administered 2018-02-26: 100 mg via INTRAVENOUS

## 2018-02-26 SURGICAL SUPPLY — 31 items
BLADE CLIPPER SURG (BLADE) ×3 IMPLANT
BLADE SURG 15 STRL LF DISP TIS (BLADE) ×1 IMPLANT
BLADE SURG 15 STRL SS (BLADE) ×2
CHLORAPREP W/TINT 26ML (MISCELLANEOUS) ×3 IMPLANT
COVER WAND RF STERILE (DRAPES) IMPLANT
DECANTER SPIKE VIAL GLASS SM (MISCELLANEOUS) IMPLANT
DERMABOND ADVANCED (GAUZE/BANDAGES/DRESSINGS) ×2
DERMABOND ADVANCED .7 DNX12 (GAUZE/BANDAGES/DRESSINGS) ×1 IMPLANT
DRAIN PENROSE 1/4X12 LTX (DRAIN) IMPLANT
DRAPE LAPAROTOMY 77X122 PED (DRAPES) ×3 IMPLANT
ELECT CAUTERY BLADE 6.4 (BLADE) ×3 IMPLANT
ELECT REM PT RETURN 9FT ADLT (ELECTROSURGICAL) ×3
ELECTRODE REM PT RTRN 9FT ADLT (ELECTROSURGICAL) ×1 IMPLANT
GLOVE BIO SURGEON STRL SZ7 (GLOVE) ×6 IMPLANT
GLOVE BIOGEL PI IND STRL 7.5 (GLOVE) ×2 IMPLANT
GLOVE BIOGEL PI INDICATOR 7.5 (GLOVE) ×4
GOWN STRL REUS W/ TWL LRG LVL3 (GOWN DISPOSABLE) ×1 IMPLANT
GOWN STRL REUS W/TWL LRG LVL3 (GOWN DISPOSABLE) ×2
INSERT FOAM CUSHION (MISCELLANEOUS) IMPLANT
KIT TURNOVER KIT A (KITS) ×3 IMPLANT
MESH VENTRALEX ST 1-7/10 CRC S (Mesh General) ×3 IMPLANT
NEEDLE HYPO 22GX1.5 SAFETY (NEEDLE) ×3 IMPLANT
NS IRRIG 1000ML POUR BTL (IV SOLUTION) ×3 IMPLANT
PACK BASIN MINOR ARMC (MISCELLANEOUS) ×3 IMPLANT
SUT ETHIBOND 0 MO6 C/R (SUTURE) ×3 IMPLANT
SUT MNCRL AB 4-0 PS2 18 (SUTURE) ×3 IMPLANT
SUT VIC AB 2-0 SH 27 (SUTURE) ×2
SUT VIC AB 2-0 SH 27XBRD (SUTURE) ×1 IMPLANT
SUT VIC AB 3-0 SH 27 (SUTURE) ×2
SUT VIC AB 3-0 SH 27X BRD (SUTURE) ×1 IMPLANT
SYR 10ML LL (SYRINGE) ×3 IMPLANT

## 2018-02-26 NOTE — Transfer of Care (Signed)
Immediate Anesthesia Transfer of Care Note  Patient: Mark Cuevas  Procedure(s) Performed: HERNIA REPAIR UMBILICAL ADULT WITH MESH (N/A )  Patient Location: PACU  Anesthesia Type:General  Level of Consciousness: unresponsive  Airway & Oxygen Therapy: Patient Spontanous Breathing and Patient connected to face mask oxygen  Post-op Assessment: Report given to RN and Post -op Vital signs reviewed and stable  Post vital signs: Reviewed and stable  Last Vitals:  Vitals Value Taken Time  BP 119/73 02/26/2018  9:15 AM  Temp 36.9 C 02/26/2018  9:15 AM  Pulse 66 02/26/2018  9:19 AM  Resp 12 02/26/2018  9:19 AM  SpO2 100 % 02/26/2018  9:19 AM  Vitals shown include unvalidated device data.  Last Pain:  Vitals:   02/26/18 0915  TempSrc:   PainSc: Asleep         Complications: No apparent anesthesia complications

## 2018-02-26 NOTE — Interval H&P Note (Signed)
History and Physical Interval Note:  02/26/2018 7:23 AM  Mark Cuevas  has presented today for surgery, with the diagnosis of umbilical hernia  The various methods of treatment have been discussed with the patient and family. After consideration of risks, benefits and other options for treatment, the patient has consented to  Procedure(s): HERNIA REPAIR UMBILICAL ADULT WITH MESH (N/A) as a surgical intervention .  The patient's history has been reviewed, patient examined, no change in status, stable for surgery.  I have reviewed the patient's chart and labs.  Questions were answered to the patient's satisfaction.     Ancil Linsey

## 2018-02-26 NOTE — Anesthesia Preprocedure Evaluation (Signed)
Anesthesia Evaluation  Patient identified by MRN, date of birth, ID band Patient awake    Reviewed: Allergy & Precautions, H&P , NPO status , Patient's Chart, lab work & pertinent test results, reviewed documented beta blocker date and time   History of Anesthesia Complications Negative for: history of anesthetic complications  Airway Mallampati: I  TM Distance: >3 FB Neck ROM: full    Dental  (+) Dental Advidsory Given, Caps, Missing   Pulmonary neg shortness of breath, neg sleep apnea, COPD,  COPD inhaler, neg recent URI, Current Smoker,           Cardiovascular Exercise Tolerance: Good hypertension, (-) angina+ CAD, + Past MI and + Cardiac Stents  (-) CABG (-) dysrhythmias (-) Valvular Problems/Murmurs     Neuro/Psych negative neurological ROS  negative psych ROS   GI/Hepatic Neg liver ROS, GERD  ,  Endo/Other  negative endocrine ROS  Renal/GU negative Renal ROS  negative genitourinary   Musculoskeletal   Abdominal   Peds  Hematology negative hematology ROS (+)   Anesthesia Other Findings Past Medical History: No date: COPD (chronic obstructive pulmonary disease) (HCC) No date: GERD (gastroesophageal reflux disease) No date: History of methicillin resistant staphylococcus aureus (MRSA) No date: Hypertension No date: Myocardial infarction (HCC)     Comment:  2014   Reproductive/Obstetrics negative OB ROS                             Anesthesia Physical Anesthesia Plan  ASA: II  Anesthesia Plan: General   Post-op Pain Management:    Induction: Intravenous  PONV Risk Score and Plan: 1 and Ondansetron, Dexamethasone, Midazolam, Promethazine and Treatment may vary due to age or medical condition  Airway Management Planned: LMA and Oral ETT  Additional Equipment:   Intra-op Plan:   Post-operative Plan: Extubation in OR  Informed Consent: I have reviewed the patients History  and Physical, chart, labs and discussed the procedure including the risks, benefits and alternatives for the proposed anesthesia with the patient or authorized representative who has indicated his/her understanding and acceptance.   Dental Advisory Given  Plan Discussed with: Anesthesiologist, CRNA and Surgeon  Anesthesia Plan Comments:         Anesthesia Quick Evaluation

## 2018-02-26 NOTE — Discharge Instructions (Addendum)
In addition to included general post-operative instructions for Open Repair of Umbilical Hernia with mesh,  Diet: Resume home heart healthy diet.   Activity: No heavy lifting >20 pounds (children, pets, laundry, garbage) or strenuous activity until follow-up, but light activity and walking are encouraged. Do not drive or drink alcohol if taking narcotic pain medications.  Wound care: Remove dressing in 2 days unless otherwise instructed. Once dressing removed, 2 days after surgery (Sunday, 11/10), you may shower/get incision wet with soapy water and pat dry (do not rub incisions), but no baths or submerging incision underwater until follow-up.   Medications: Resume all home medications. For mild to moderate pain: acetaminophen (Tylenol) or ibuprofen/naproxen (if no kidney disease). Combining Tylenol with alcohol can substantially increase your risk of causing liver disease. Narcotic pain medications, if prescribed, can be used for severe pain, though may cause nausea, constipation, and drowsiness. Do not combine Tylenol and Percocet (or similar) within a 6 hour period as Percocet (and similar) contain(s) Tylenol. If you do not need the narcotic pain medication, you do not need to fill the prescription.  Call office [phone]) at any time if any questions, worsening pain, fevers/chills, bleeding, drainage from incision site, or other concerns.   AMBULATORY SURGERY  DISCHARGE INSTRUCTIONS   1) The drugs that you were given will stay in your system until tomorrow so for the next 24 hours you should not:  A) Drive an automobile B) Make any legal decisions C) Drink any alcoholic beverage   2) You may resume regular meals tomorrow.  Today it is better to start with liquids and gradually work up to solid foods.  You may eat anything you prefer, but it is better to start with liquids, then soup and crackers, and gradually work up to solid foods.   3) Please notify your doctor immediately if  you have any unusual bleeding, trouble breathing, redness and pain at the surgery site, drainage, fever, or pain not relieved by medication.    4) Additional Instructions:        Please contact your physician with any problems or Same Day Surgery at [phone], Monday through Friday 6 am to 4 pm, or [place] at [facility] number at [phone].   Open Hernia Repair, Adult, Care After These instructions give you information about caring for yourself after your procedure. Your doctor may also give you more specific instructions. If you have problems or questions, contact your doctor. Follow these instructions at home: Surgical cut (incision) care   Follow instructions from your doctor about how to take care of your surgical cut area. Make sure you: ? Wash your hands with soap and water before you change your bandage (dressing). If you cannot use soap and water, use hand sanitizer. ? Change your bandage as told by your doctor. ? Leave stitches (sutures), skin glue, or skin tape (adhesive) strips in place. They may need to stay in place for 2 weeks or longer. If tape strips get loose and curl up, you may trim the loose edges. Do not remove tape strips completely unless your doctor says it is okay.  Check your surgical cut every day for signs of infection. Check for: ? More redness, swelling, or pain. ? More fluid or blood. ? Warmth. ? Pus or a bad smell. Activity  Do not drive or use heavy machinery while taking prescription pain medicine. Do not drive until your doctor says it is okay.  Until your doctor says it is okay: ? Do  not lift anything that is heavier than 10 lb (4.5 kg). ? Do not play contact sports.  Return to your normal activities as told by your doctor. Ask your doctor what activities are safe. General instructions  To prevent or treat having a hard time pooping (constipation) while you are taking prescription pain medicine, your doctor may recommend that  you: ? Drink enough fluid to keep your pee (urine) clear or pale yellow. ? Take over-the-counter or prescription medicines. ? Eat foods that are high in fiber, such as fresh fruits and vegetables, whole grains, and beans. ? Limit foods that are high in fat and processed sugars, such as fried and sweet foods.  Take over-the-counter and prescription medicines only as told by your doctor.  Do not take baths, swim, or use a hot tub until your doctor says it is okay.  Keep all follow-up visits as told by your doctor. This is important. Contact a doctor if:  You develop a rash.  You have more redness, swelling, or pain around your surgical cut.  You have more fluid or blood coming from your surgical cut.  Your surgical cut feels warm to the touch.  You have pus or a bad smell coming from your surgical cut.  You have a fever or chills.  You have blood in your poop (stool).  You have not pooped in 2-3 days.  Medicine does not help your pain. Get help right away if:  You have chest pain or you are short of breath.  You feel light-headed.  You feel weak and dizzy (feel faint).  You have very bad pain.  You throw up (vomit) and your pain is worse. This information is not intended to replace advice given to you by your health care provider. Make sure you discuss any questions you have with your health care provider. Document Released: 04/28/2014 Document Revised: 10/26/2015 Document Reviewed: 09/19/2015 Elsevier Interactive Patient Education  [employer].

## 2018-02-26 NOTE — Anesthesia Post-op Follow-up Note (Signed)
Anesthesia QCDR form completed.        

## 2018-02-26 NOTE — Anesthesia Procedure Notes (Signed)
Procedure Name: Intubation Date/Time: 02/26/2018 7:44 AM Performed by: Doreen Salvage, CRNA Pre-anesthesia Checklist: Patient identified, Patient being monitored, Timeout performed, Emergency Drugs available and Suction available Patient Re-evaluated:Patient Re-evaluated prior to induction Oxygen Delivery Method: Circle system utilized Preoxygenation: Pre-oxygenation with 100% oxygen Induction Type: IV induction Ventilation: Mask ventilation without difficulty Laryngoscope Size: Mac and 3 Grade View: Grade I Tube type: Oral Tube size: 7.5 mm Number of attempts: 1 Airway Equipment and Method: Stylet Placement Confirmation: ETT inserted through vocal cords under direct vision,  positive ETCO2 and breath sounds checked- equal and bilateral Secured at: 23 cm Tube secured with: Tape Dental Injury: Teeth and Oropharynx as per pre-operative assessment

## 2018-02-26 NOTE — Op Note (Signed)
SURGICAL OPERATIVE REPORT  DATE OF PROCEDURE: 02/26/2018  ATTENDING Surgeon(s): Ancil Linsey, MD  ANESTHESIA: GETA  PRE-OPERATIVE DIAGNOSIS: Increasingly symptomatic (painful) incarcerated umbilical hernia (icd-10: K43.9)  POST-OPERATIVE DIAGNOSIS: Increasingly symptomatic (painful) incarcerated umbilical hernia (icd-10: K43.9)  PROCEDURE(S):  1.) Open repair of increasingly symptomatic (painful) umbilical hernia with mesh (cpt: 16109)  INTRAOPERATIVE FINDINGS: 1.5 cm umbilical hernia with incarcerated and adherent omentum and hernia sac  INTRAVENOUS FLUIDS: 1000 mL crystalloid   ESTIMATED BLOOD LOSS: Minimal (<20 mL)   URINE OUTPUT: No Foley catheter  SPECIMENS: None  IMPLANTS: 4.3 cm Bard Ventralex ST ventral hernia repair mesh  DRAINS: None  COMPLICATIONS: None apparent  CONDITION AT END OF PROCEDURE: Hemodynamically stable and extubated  DISPOSITION OF PATIENT: PACU  INDICATIONS FOR PROCEDURE:  Patient is a 57 y.o. male who presented upon referral from his PMD for evaluation of patient's umbilical hernia. He reported chronic constipation and frequent smoking-associated coughing, though denied abdominal distention, change in bowel function, or N/V and likewise denied straining with urination. He initially declined repair, stating that it didn't bother him enough, and management of his constipation was advised. Shortly thereafter, however, patient presented to [facility] ED for severe umbilical pain associated with incarceration of his hernia. While in the ED, his hernia reduced spontaneously, and he subsequently returned to the office to request repair of his hernia, at which time his hernia was again non-reducible without any signs/symptoms of obstruction. All risks, benefits, and alternatives to above elective procedure were discussed with the patient, all of patient's questions were answered to his expressed satisfaction, and informed consent was obtained and documented  accordingly.  DETAILS OF PROCEDURE: Patient was brought to the operating suite and appropriately identified. General anesthesia was administered along with appropriate pre-operative antibiotics, and endotracheal intubation was performed by anesthetist. In supine position, operative site was prepped and draped in the usual sterile fashion, and following a brief time out, local anesthetic was injected inferior to the umbilicus, and a 3 cm transverse curvilinear incision was made using a #15 blade scalpel and extended deep through subcutaneous tissues using blunt dissection and electrocautery. The hernia sac was then dissected from the undersurface of the umbilical skin and stalk, and the fascial defect was cleared of all omental adhesions. A 4.3 cm Bard Ventralex ST mesh patch was selected, inserted, confirmed to approximate well to fascial edges without intervening viscera or omentum, and patch was secured without tension using Ethibond braided non-absorbable suture, which was also used to re-approximate fascia over secured hernia mesh. The umbilicus was then invaginated using 2-0 Vicryl suture from the dermis to fascia, and the wound was irrigated using sterile saline. Overlying tissues were re-approximated in layers using buried interrupted 3-0 Vicryl suture for dermis. Skin was then cleaned and dried, and sterile Dermabond skin glue was applied and allowed to dry.  Patient was then safely able to be extubated, awakened, and transferred to PACU for post-operative monitoring and care.  I was present for all aspects of the above procedure, and no operative complications were apparent.

## 2018-02-27 NOTE — Anesthesia Postprocedure Evaluation (Signed)
Anesthesia Post Note  Patient: Mark Cuevas  Procedure(s) Performed: HERNIA REPAIR UMBILICAL ADULT WITH MESH (N/A )  Patient location during evaluation: PACU Anesthesia Type: General Level of consciousness: awake and alert Pain management: pain level controlled Vital Signs Assessment: post-procedure vital signs reviewed and stable Respiratory status: spontaneous breathing, nonlabored ventilation, respiratory function stable and patient connected to nasal cannula oxygen Cardiovascular status: blood pressure returned to baseline and stable Postop Assessment: no apparent nausea or vomiting Anesthetic complications: no     Last Vitals:  Vitals:   02/26/18 0954 02/26/18 1001  BP: 122/84 (!) 120/95  Pulse: 69 71  Resp: 18 16  Temp: 36.9 C (!) 36.2 C  SpO2: 94% 96%    Last Pain:  Vitals:   02/26/18 1001  TempSrc: Temporal  PainSc: 4                  Lenard Simmer

## 2018-03-01 ENCOUNTER — Encounter: Payer: Self-pay | Admitting: Surgery

## 2018-03-03 ENCOUNTER — Telehealth: Payer: Self-pay | Admitting: *Deleted

## 2018-03-03 NOTE — Telephone Encounter (Signed)
Patient called and stated that he needs a back to work note. He had surgery on 02/26/18

## 2018-03-03 NOTE — Telephone Encounter (Signed)
Patient states he has already went back to work. Strongly encouraged patient to not be doing any heavy lifting at this time, he agrees. Will fax work note with no heavy lifting over 20 pounds restrictions x 6 weeks.

## 2018-03-03 NOTE — Telephone Encounter (Signed)
Patient is calling and is needing a note to release him to go back to work. Patients fax number [phone] attn: Tammy @ [employer]. Please call patient and advise if any questions.

## 2018-03-11 ENCOUNTER — Encounter: Payer: Self-pay | Admitting: Surgery

## 2018-03-11 ENCOUNTER — Other Ambulatory Visit: Payer: Self-pay

## 2018-03-11 ENCOUNTER — Ambulatory Visit: Payer: No Typology Code available for payment source | Admitting: Surgery

## 2018-03-11 ENCOUNTER — Ambulatory Visit (INDEPENDENT_AMBULATORY_CARE_PROVIDER_SITE_OTHER): Payer: No Typology Code available for payment source | Admitting: Surgery

## 2018-03-11 VITALS — BP 129/76 | HR 71 | Temp 97.9°F | Resp 12 | Ht 67.0 in | Wt 178.0 lb

## 2018-03-11 DIAGNOSIS — Z4889 Encounter for other specified surgical aftercare: Secondary | ICD-10-CM

## 2018-03-11 DIAGNOSIS — K429 Umbilical hernia without obstruction or gangrene: Secondary | ICD-10-CM

## 2018-03-11 NOTE — Patient Instructions (Signed)
Return as needed.The patient is aware to call back for any questions or concerns.  

## 2018-03-11 NOTE — Progress Notes (Signed)
Surgical Clinic Progress/Follow-up Note   HPI:  57 y.o. Male presents to clinic for post-op follow-up 13 Days s/p open repair of increasingly symptomatic incarcerated umbilical hernia with mesh Earlene Plater(Davis, 03/03/2018). Patient reports complete resolution of pre-operative pain and has been tolerating regular diet with +flatus and normal BM's, denies N/V, fever/chills, CP, or SOB. He expresses he is very happy with the results of his hernia repair and has not needed narcotic pain medication, but he does admit he continues to smoke.  Review of Systems:  Constitutional: denies fever/chills  Respiratory: denies shortness of breath, wheezing  Cardiovascular: denies chest pain, palpitations  Gastrointestinal: abdominal pain, N/V, and bowel function as per interval history Skin: Denies any other rashes or skin discolorations except post-surgical wounds as per interval history  Vital Signs:  BP 129/76   Pulse 71   Temp 97.9 F (36.6 C) (Skin)   Resp 12   Ht 5\' 7"  (1.702 m)   Wt 178 lb (80.7 kg)   BMI 27.88 kg/m   Physical Exam:  Constitutional:  -- Normal body habitus  -- Awake, alert, and oriented x3  Pulmonary:  -- No crackles -- Equal breath sounds bilaterally -- Breathing non-labored at rest Cardiovascular:  -- S1, S2 present  -- No pericardial rubs  Gastrointestinal:  -- Soft and non-distended, completely non-tender to palpation, no guarding/rebound tenderness -- Post-surgical incisions all well-approximated without any peri-incisional erythema or drainage -- No abdominal masses appreciated, pulsatile or otherwise  Musculoskeletal / Integumentary:  -- Wounds or skin discoloration: None appreciated except post-surgical incisions as described above (GI) -- Extremities: B/L UE and LE FROM, hands and feet warm, no edema   Imaging: No new pertinent imaging available for review  Assessment:  57 y.o. yo Male with a problem list including...  Patient Active Problem List   Diagnosis  Date Noted  . Umbilical hernia without obstruction and without gangrene 01/19/2018    presents to clinic for post-op follow-up evaluation, doing very well 13 Days s/p open repair of increasingly symptomatic incarcerated umbilical hernia with mesh Earlene Plater(Davis, 03/03/2018).  Plan:              - advance diet as tolerated              - okay to submerge incisions under water (baths, swimming) prn             - no heavy lifting >40 lbs x 4 more weeks, after which may gradually resume activities without restrictions             - apply sunblock particularly to incisions with sun exposure to reduce pigmentation of scars             - return to clinic as needed, instructed to call office if any questions or concerns  - smoking cessation again discussed and encouraged  All of the above recommendations were discussed with the patient, and all of patient's questions were answered to his expressed satisfaction.  -- Scherrie GerlachJason E. Earlene Plateravis, MD, RPVI [place]: [facility] General [facility] - Partnering for exceptional care. Office: [phone]

## 2018-04-08 ENCOUNTER — Ambulatory Visit (INDEPENDENT_AMBULATORY_CARE_PROVIDER_SITE_OTHER): Payer: No Typology Code available for payment source | Admitting: Surgery

## 2018-04-08 ENCOUNTER — Other Ambulatory Visit: Payer: Self-pay

## 2018-04-08 ENCOUNTER — Encounter: Payer: Self-pay | Admitting: Surgery

## 2018-04-08 VITALS — BP 120/76 | HR 73 | Temp 98.1°F | Resp 16 | Ht 67.0 in | Wt 176.0 lb

## 2018-04-08 DIAGNOSIS — Z4889 Encounter for other specified surgical aftercare: Secondary | ICD-10-CM

## 2018-04-08 DIAGNOSIS — T8189XA Other complications of procedures, not elsewhere classified, initial encounter: Secondary | ICD-10-CM

## 2018-04-08 NOTE — Patient Instructions (Addendum)
Please see your follow up appointment listed below.  If the area worsens please let us know.

## 2018-04-08 NOTE — Progress Notes (Signed)
Surgical Clinic Progress/Follow-up Note   HPI:  57 y.o. Male presents to clinic for subsequent post-op follow-up 1 month s/p open repair of his increasingly symptomatic incarcerated umbilical hernia with mesh Earlene Plater(Davis, 03/03/2018). Patient reports some "firmness" around his incision has persisted since his surgery, focally greater at the Right aspect of his otherwise well-healed infra-umbilical post-surgical wound. He says antibiotics were prescribed for him by his primary care physician, which he completed 4 - 5 days ago. The firmness and focal erythema over the Right aspect of his well-healed post-surgical infra-umbilical wound seems to have improved during this time over the past 2 weeks and has not worsened since discontinuation of his antibiotics. He otherwise reports complete resolution of his pre-operative pain/bulge and has been tolerating regular diet with +flatus and normal BM's, denies N/V, fever/chills, CP, or SOB. Of note, patient also proudly reports he filled his prescription for Chantix and is eager and committed to quit smoking.  Review of Systems:  Constitutional: denies fever/chills  Respiratory: denies shortness of breath, wheezing  Cardiovascular: denies chest pain, palpitations  Gastrointestinal: abdominal pain, N/V, and bowel function as per interval history Skin: Denies any other rashes or skin discolorations except post-surgical wounds as per interval history  Vital Signs:  BP 120/76   Pulse 73   Temp 98.1 F (36.7 C) (Temporal)   Resp 16   Ht 5\' 7"  (1.702 m)   Wt 176 lb (79.8 kg)   SpO2 95%   BMI 27.57 kg/m    Physical Exam:  Constitutional:  -- Normal body habitus  -- Awake, alert, and oriented x3  Pulmonary:  -- No crackles -- Equal breath sounds bilaterally -- Breathing non-labored at rest Cardiovascular:  -- S1, S2 present  -- No pericardial rubs  Gastrointestinal:  -- Soft and non-distended with no guarding/rebound tenderness -- Focally greater  firmness at Right aspect of otherwise well-approximated post-surgical infra-umbilical wound than across the rest of the wound with a small amount of minimally tender to palpation focal erythema without any appreciable fluctuance or drainage -- No abdominal masses appreciated, such as recurrent hernia, pulsatile or otherwise  Musculoskeletal / Integumentary:  -- Wounds or skin discoloration: None appreciated except post-surgical incisions as described above (GI) -- Extremities: B/L UE and LE FROM, hands and feet warm, no edema   Imaging: No new pertinent imaging available for review  Assessment:  57 y.o. yo Male with a problem list including...  Patient Active Problem List   Diagnosis Date Noted  . Umbilical hernia without obstruction and without gangrene 01/19/2018    presents to clinic for evaluation of what appears to be post-surgical suture granuloma at the Right aspect of otherwise well-healed infra-umbilical post-surgical wound without any worsening 4 - 5 days after completion of antibiotics prescribed by patient's primary care physician, doing otherwise well 1 month s/p open repair of his increasingly symptomatic incarcerated umbilical hernia with mesh Earlene Plater(Davis, 03/03/2018).  Plan:              - continue current management             - monitor ongoing improvement of post-surgical scar for any signs/symptoms of infection             - return to clinic in 2 weeks to confirm ongoing improvement  - instructed to call office if any questions or concerns  - smoking cessation efforts applauded/encouraged  All of the above recommendations were discussed with the patient, and all of patient's questions were answered to his  expressed satisfaction.  -- Marilynne Drivers Rosana Hoes, MD, [place]: [facility] - Partnering for exceptional care. Office: [phone]

## 2018-04-22 ENCOUNTER — Encounter: Payer: Self-pay | Admitting: Surgery

## 2019-01-25 ENCOUNTER — Encounter: Payer: Self-pay | Admitting: Surgery

## 2019-01-25 ENCOUNTER — Ambulatory Visit (INDEPENDENT_AMBULATORY_CARE_PROVIDER_SITE_OTHER): Payer: No Typology Code available for payment source | Admitting: Surgery

## 2019-01-25 ENCOUNTER — Other Ambulatory Visit: Payer: Self-pay

## 2019-01-25 VITALS — BP 129/78 | HR 68 | Temp 98.1°F | Ht 67.0 in | Wt 152.8 lb

## 2019-01-25 DIAGNOSIS — R109 Unspecified abdominal pain: Secondary | ICD-10-CM

## 2019-01-25 NOTE — Progress Notes (Signed)
01/25/2019  History of Present Illness: Mark Cuevas is a 58 y.o. male status post umbilical hernia repair with Dr. Earlene Plater on 02/26/2018.  Postoperatively, patient did have some issues with firmness at the incision site but this was thought to be related to scar tissue.  He presents today because he has had a couple of episodes of abdominal fullness and tightness going in the middle of the abdomen.  This has happened at different scenarios when he has been active but was uncertain as to why this was happening.  He reports that sometimes the discomfort did cause some shortness of breath.  Otherwise denies any fevers, chills, chest pain.  Denies any nausea or vomiting.  Denies any constipation or diarrhea.  This is only happened 2 or 3 times and last time was about 2 weeks ago.  He almost decided not to come to the appointment today but he figured he would get checked out as a precaution.  Of note, he reports that he still smokes, he has been losing weight and reports that he is losing about 1 pound per week.  He does report that he is very active at his work.  Past Medical History: Past Medical History:  Diagnosis Date  . COPD (chronic obstructive pulmonary disease) (HCC)   . GERD (gastroesophageal reflux disease)   . History of methicillin resistant staphylococcus aureus (MRSA)   . Hypertension   . Myocardial infarction (HCC)    2014  . Umbilical hernia without obstruction and without gangrene 01/19/2018     Past Surgical History: Past Surgical History:  Procedure Laterality Date  . CORONARY ANGIOPLASTY     2 stents  . CORONARY STENT PLACEMENT  2014  . UMBILICAL HERNIA REPAIR N/A 02/26/2018   Procedure: HERNIA REPAIR UMBILICAL ADULT WITH MESH;  Surgeon: Ancil Linsey, MD;  Location: [facility] ORS;  Service: General;  Laterality: N/A;    Home Medications: Prior to Admission medications   Medication Sig Start Date End Date Taking? Authorizing Provider  albuterol (PROVENTIL HFA;VENTOLIN HFA)  108 (90 Base) MCG/ACT inhaler Inhale 1-2 puffs into the lungs every 4 (four) hours as needed for wheezing or shortness of breath.  12/25/17  Yes [provider]  ANORO ELLIPTA 62.5-25 MCG/INH AEPB Inhale 1 puff into the lungs daily as needed (for shortness of breath or wheezing).  12/25/17  Yes [provider]  aspirin EC 81 MG tablet Take 81 mg by mouth daily.   Yes [provider]  carvedilol (COREG) 3.125 MG tablet Take 3.125 mg by mouth 2 (two) times daily. 12/25/17  Yes [provider]  lovastatin (MEVACOR) 20 MG tablet Take 20 mg by mouth daily. 12/25/17  Yes [provider]  omeprazole (PRILOSEC) 20 MG capsule Take 20 mg by mouth daily.   Yes [provider]    Allergies: No Known Allergies  Review of Systems: Review of Systems  Constitutional: Negative for chills and fever.  Respiratory: Negative for shortness of breath.   Cardiovascular: Negative for chest pain.  Gastrointestinal: Positive for abdominal pain. Negative for nausea and vomiting.    Physical Exam BP 129/78   Pulse 68   Temp 98.1 F (36.7 C)   Ht 5\' 7"  (1.702 m)   Wt 152 lb 12.8 oz (69.3 kg)   SpO2 95%   BMI 23.93 kg/m  CONSTITUTIONAL: No acute distress HEENT:  Normocephalic, atraumatic, extraocular motion intact. RESPIRATORY:  Lungs are clear, and breath sounds are equal bilaterally. Normal respiratory effort without pathologic use of  accessory muscles. CARDIOVASCULAR: Heart is regular without murmurs, gallops, or rubs. GI: The abdomen is soft, nondistended, nontender to palpation.  Patient's umbilical incision is well-healed and very faint.  There is no evidence of any recurrent hernia at the umbilical site and there is no evidence of any hernia along the abdominal wall.  NEUROLOGIC:  Motor and sensation is grossly normal.  Cranial nerves are grossly intact. PSYCH:  Alert and oriented to person, place and time. Affect is normal.  Labs/Imaging: None  recently  Assessment and Plan: This is a 58 y.o. male status post umbilical hernia repair with mesh with Dr. Rosana Hoes on 02/26/2018.  - Discussed with the patient that it is unclear why he had these episodes of abdominal discomfort or pulling/tightness.  This could be related to scar tissue that is been developing in the mid abdomen where his hernia repair was done.  At least on exam today there does not appear to be any concerning findings.  No acute surgical needs and at this point I do not see any reason for imaging studies.  Patient will follow-up as needed.  Face-to-face time spent with the patient and care providers was 15 minutes, with more than 50% of the time spent counseling, educating, and coordinating care of the patient.     Mark Cuevas, [facility]

## 2019-01-25 NOTE — Patient Instructions (Addendum)
Follow-up with our office as needed.  Please call and ask to speak with a nurse if you develop questions or concerns.  Please make a follow up appointment with your primary care physician regarding weight loss.     Steps to Quit Smoking Smoking tobacco is the leading cause of preventable death. It can affect almost every organ in the body. Smoking puts you and those around you at risk for developing many serious chronic diseases. Quitting smoking can be difficult, but it is one of the best things that you can do for your health. It is never too late to quit. How do I get ready to quit? When you decide to quit smoking, create a plan to help you succeed. Before you quit:  Pick a date to quit. Set a date within the next 2 weeks to give you time to prepare.  Write down the reasons why you are quitting. Keep this list in places where you will see it often.  Tell your family, friends, and co-workers that you are quitting. Support from your loved ones can make quitting easier.  Talk with your health care provider about your options for quitting smoking.  Find out what treatment options are covered by your health insurance.  Identify people, places, things, and activities that make you want to smoke (triggers). Avoid them. What first steps can I take to quit smoking?  Throw away all cigarettes at home, at work, and in your car.  Throw away smoking accessories, such as Set designer.  Clean your car. Make sure to empty the ashtray.  Clean your home, including curtains and carpets. What strategies can I use to quit smoking? Talk with your health care provider about combining strategies, such as taking medicines while you are also receiving in-person counseling. Using these two strategies together makes you more likely to succeed in quitting than if you used either strategy on its own.  If you are pregnant or breastfeeding, talk with your health care provider about finding counseling  or other support strategies to quit smoking. Do not take medicine to help you quit smoking unless your health care provider tells you to do so. To quit smoking: Quit right away  Quit smoking completely, instead of gradually reducing how much you smoke over a period of time. Research shows that stopping smoking right away is more successful than gradually quitting.  Attend in-person counseling to help you build problem-solving skills. You are more likely to succeed in quitting if you attend counseling sessions regularly. Even short sessions of 10 minutes can be effective. Take medicine You may take medicines to help you quit smoking. Some medicines require a prescription and some you can purchase over-the-counter. Medicines may have nicotine in them to replace the nicotine in cigarettes. Medicines may:  Help to stop cravings.  Help to relieve withdrawal symptoms. Your health care provider may recommend:  Nicotine patches, gum, or lozenges.  Nicotine inhalers or sprays.  Non-nicotine medicine that is taken by mouth. Find resources Find resources and support systems that can help you to quit smoking and remain smoke-free after you quit. These resources are most helpful when you use them often. They include:  Online chats with a Veterinary surgeon.  Telephone quitlines.  Printed Materials engineer.  Support groups or group counseling.  Text messaging programs.  Mobile phone apps or applications. Use apps that can help you stick to your quit plan by providing reminders, tips, and encouragement. There are many free apps for mobile devices as  well as websites. Examples include Quit Guide from the [employer] and smokefree.gov What things can I do to make it easier to quit?   Reach out to your family and friends for support and encouragement. Call telephone quitlines (1-800-QUIT-NOW), reach out to support groups, or work with a counselor for support.  Ask people who smoke to avoid smoking around  you.  Avoid places that trigger you to smoke, such as bars, parties, or smoke-break areas at work.  Spend time with people who do not smoke.  Lessen the stress in your life. Stress can be a smoking trigger for some people. To lessen stress, try: ? Exercising regularly. ? Doing deep-breathing exercises. ? Doing yoga. ? Meditating. ? Performing a body scan. This involves closing your eyes, scanning your body from head to toe, and noticing which parts of your body are particularly tense. Try to relax the muscles in those areas. How will I feel when I quit smoking? Day 1 to 3 weeks Within the first 24 hours of quitting smoking, you may start to feel withdrawal symptoms. These symptoms are usually most noticeable 2-3 days after quitting, but they usually do not last for more than 2-3 weeks. You may experience these symptoms:  Mood swings.  Restlessness, anxiety, or irritability.  Trouble concentrating.  Dizziness.  Strong cravings for sugary foods and nicotine.  Mild weight gain.  Constipation.  Nausea.  Coughing or a sore throat.  Changes in how the medicines that you take for unrelated issues work in your body.  Depression.  Trouble sleeping (insomnia). Week 3 and afterward After the first 2-3 weeks of quitting, you may start to notice more positive results, such as:  Improved sense of smell and taste.  Decreased coughing and sore throat.  Slower heart rate.  Lower blood pressure.  Clearer skin.  The ability to breathe more easily.  Fewer sick days. Quitting smoking can be very challenging. Do not get discouraged if you are not successful the first time. Some people need to make many attempts to quit before they achieve long-term success. Do your best to stick to your quit plan, and talk with your health care provider if you have any questions or concerns. Summary  Smoking tobacco is the leading cause of preventable death. Quitting smoking is one of the best  things that you can do for your health.  When you decide to quit smoking, create a plan to help you succeed.  Quit smoking right away, not slowly over a period of time.  When you start quitting, seek help from your health care provider, family, or friends. This information is not intended to replace advice given to you by your health care provider. Make sure you discuss any questions you have with your health care provider. Document Released: 04/01/2001 Document Revised: 06/25/2018 Document Reviewed: 06/26/2018 Elsevier Patient Education  2020 [employer].

## 2019-09-30 ENCOUNTER — Emergency Department: Payer: 59

## 2019-09-30 ENCOUNTER — Emergency Department
Admission: EM | Admit: 2019-09-30 | Discharge: 2019-10-01 | Disposition: A | Discharge status: Home / Self Care | Payer: 59 | Attending: Emergency Medicine | Admitting: Emergency Medicine

## 2019-09-30 ENCOUNTER — Other Ambulatory Visit: Payer: Self-pay

## 2019-09-30 ENCOUNTER — Encounter: Payer: Self-pay | Admitting: *Deleted

## 2019-09-30 DIAGNOSIS — W208XXA Other cause of strike by thrown, projected or falling object, initial encounter: Secondary | ICD-10-CM | POA: Insufficient documentation

## 2019-09-30 DIAGNOSIS — I10 Essential (primary) hypertension: Secondary | ICD-10-CM | POA: Insufficient documentation

## 2019-09-30 DIAGNOSIS — Y990 Civilian activity done for income or pay: Secondary | ICD-10-CM | POA: Insufficient documentation

## 2019-09-30 DIAGNOSIS — S0990XA Unspecified injury of head, initial encounter: Secondary | ICD-10-CM | POA: Diagnosis not present

## 2019-09-30 DIAGNOSIS — S0181XA Laceration without foreign body of other part of head, initial encounter: Secondary | ICD-10-CM | POA: Diagnosis not present

## 2019-09-30 DIAGNOSIS — Z7982 Long term (current) use of aspirin: Secondary | ICD-10-CM | POA: Insufficient documentation

## 2019-09-30 DIAGNOSIS — S025XXA Fracture of tooth (traumatic), initial encounter for closed fracture: Secondary | ICD-10-CM | POA: Insufficient documentation

## 2019-09-30 DIAGNOSIS — Z79899 Other long term (current) drug therapy: Secondary | ICD-10-CM | POA: Diagnosis not present

## 2019-09-30 DIAGNOSIS — Y939 Activity, unspecified: Secondary | ICD-10-CM | POA: Insufficient documentation

## 2019-09-30 DIAGNOSIS — Y929 Unspecified place or not applicable: Secondary | ICD-10-CM | POA: Insufficient documentation

## 2019-09-30 DIAGNOSIS — J449 Chronic obstructive pulmonary disease, unspecified: Secondary | ICD-10-CM | POA: Diagnosis not present

## 2019-09-30 DIAGNOSIS — F17210 Nicotine dependence, cigarettes, uncomplicated: Secondary | ICD-10-CM | POA: Diagnosis not present

## 2019-09-30 DIAGNOSIS — S0993XA Unspecified injury of face, initial encounter: Secondary | ICD-10-CM

## 2019-09-30 DIAGNOSIS — I252 Old myocardial infarction: Secondary | ICD-10-CM | POA: Diagnosis not present

## 2019-09-30 MED ORDER — MORPHINE SULFATE (PF) 4 MG/ML IV SOLN
4.0000 mg | Freq: Once | INTRAVENOUS | Status: AC
  Administered 2019-09-30: 4 mg via INTRAMUSCULAR
  Filled 2019-09-30: qty 1

## 2019-09-30 MED ORDER — ONDANSETRON HCL 4 MG PO TABS: 4.0000 mg | ORAL_TABLET | Freq: Three times a day (TID) | ORAL | 0 refills | Status: DC | PRN

## 2019-09-30 MED ORDER — ONDANSETRON 4 MG PO TBDP
ORAL_TABLET | ORAL | Status: AC
  Administered 2019-10-01: 4 mg via ORAL
  Filled 2019-09-30: qty 1

## 2019-09-30 MED ORDER — ONDANSETRON 4 MG PO TBDP
4.0000 mg | ORAL_TABLET | Freq: Once | ORAL | Status: AC
  Administered 2019-09-30: 4 mg via ORAL
  Filled 2019-09-30: qty 1

## 2019-09-30 MED ORDER — MORPHINE SULFATE (PF) 4 MG/ML IV SOLN
INTRAVENOUS | Status: AC
  Filled 2019-09-30: qty 1

## 2019-09-30 MED ORDER — HYDROCODONE-ACETAMINOPHEN 5-325 MG PO TABS: 1.0000 | ORAL_TABLET | Freq: Four times a day (QID) | ORAL | 0 refills | Status: DC | PRN

## 2019-09-30 MED ORDER — FLUORESCEIN SODIUM 1 MG OP STRP
ORAL_STRIP | OPHTHALMIC | Status: AC
  Administered 2019-10-01: 1 via OPHTHALMIC
  Filled 2019-09-30: qty 1

## 2019-09-30 NOTE — ED Triage Notes (Signed)
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 Pt denies LOC or blood thinner use. No neurologic deficits noted upon arrival. Pt has multiple teeth missing and left sided front tooth loose upon assessment. 1.5 inch laceration noted to right forehead, two other smaller lacerations to the right forehead. Complicated laceration to the right eyebrow and small laceration to upper lip. Bleeding controlled for all listed lacerations. All wounds cleaned, right eye intact and irrigated. Pt reports slightly blurred vision but vision is intact.

## 2019-09-30 NOTE — ED Notes (Signed)
MD remains at bedside suturing pt.

## 2019-09-30 NOTE — Discharge Instructions (Signed)
Please seek medical attention for any high fevers, chest pain, shortness of breath, change in behavior, persistent vomiting, bloody stool or any other new or concerning symptoms.  

## 2019-09-30 NOTE — ED Provider Notes (Signed)
[facility] Emergency Department Provider Note   ____________________________________________   I have reviewed the triage vital signs and the nursing notes.   HISTORY  Chief Complaint Head Injury   History limited by: Not Limited   HPI Mark Cuevas is a 59 y.o. male who presents to the emergency department today after being injured while at work.  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  It did require and hit him in the right side of his head.  He denies any loss of consciousness.  He denies any double vision.  He states that his right eye is a little blurry however when he blinks it clears up.  He does think his tetanus was in the past 5 years. Denies any other injuries. No cough or shortness of breath.    Records reviewed. Per medical record review patient has a history of COPD, HTN, MI.   Past Medical History:  Diagnosis Date  . COPD (chronic obstructive pulmonary disease) ([place])   . GERD (gastroesophageal reflux disease)   . History of methicillin resistant staphylococcus aureus (MRSA)   . Hypertension   . Myocardial infarction ([place])    2014  . Umbilical hernia without obstruction and without gangrene 01/19/2018    Patient Active Problem List   Diagnosis Date Noted  . Umbilical hernia without obstruction and without gangrene 01/19/2018    Past Surgical History:  Procedure Laterality Date  . CORONARY ANGIOPLASTY     2 stents  . CORONARY STENT PLACEMENT  2014  . UMBILICAL HERNIA REPAIR N/A 02/26/2018   Procedure: HERNIA REPAIR UMBILICAL ADULT WITH MESH;  Surgeon: Vickie Epley, MD;  Location: [facility] ORS;  Service: General;  Laterality: N/A;    Prior to Admission medications   Medication Sig Start Date End Date Taking? Authorizing Provider  albuterol (PROVENTIL HFA;VENTOLIN HFA) 108 (90 Base) MCG/ACT inhaler Inhale 1-2 puffs into the lungs every 4 (four) hours as needed for wheezing or shortness of  breath.  12/25/17   [provider]  ANORO ELLIPTA 62.5-25 MCG/INH AEPB Inhale 1 puff into the lungs daily as needed (for shortness of breath or wheezing).  12/25/17   [provider]  aspirin EC 81 MG tablet Take 81 mg by mouth daily.    [provider]  carvedilol (COREG) 3.125 MG tablet Take 3.125 mg by mouth 2 (two) times daily. 12/25/17   [provider]  lovastatin (MEVACOR) 20 MG tablet Take 20 mg by mouth daily. 12/25/17   [provider]  omeprazole (PRILOSEC) 20 MG capsule Take 20 mg by mouth daily.    [provider]    Allergies Patient has no known allergies.  Family History  Problem Relation Age of Onset  . Esophageal cancer Mother   . Kidney disease Mother   . Heart disease Father   . Prostate cancer Brother   . Prostate cancer Maternal Grandfather     Social History Social History   Tobacco Use  . Smoking status: Current Every Day Smoker    Packs/day: 1.00    Years: 40.00    Pack years: 40.00    Types: Cigarettes  . Smokeless tobacco: Never Used  Vaping Use  . Vaping Use: Never used  Substance Use Topics  . Alcohol use: Never  . Drug use: Never    Review of Systems Constitutional: No fever/chills Eyes: No visual changes. ENT: Positive for broken teeth. Cardiovascular: Denies chest pain. Respiratory: Denies shortness  of breath. Gastrointestinal: No abdominal pain.  No nausea, no vomiting.  No diarrhea.   Genitourinary: Negative for dysuria. Musculoskeletal: Negative for back pain. Skin: Positive for laceration to face Neurological: Positive for headache.  ____________________________________________   PHYSICAL EXAM:  VITAL SIGNS: ED Triage Vitals  Enc Vitals Group     BP 162/84     Pulse 66     Resp 16     Temp 98     Temp src      SpO2 96   Constitutional: Alert and oriented.  Eyes: Right eye conjunctiva with some injection. Fluorescein stain did not show any corneal ulcer or defect ENT       Head: Normocephalic      Nose: No congestion/rhinnorhea.      Mouth/Throat: Multiple fractured teeth. Loose left upper incisor.       Neck: No stridor. No midline tenderness. Hematological/Lymphatic/Immunilogical: No cervical lymphadenopathy. Cardiovascular: Normal rate, regular rhythm.  No murmurs, rubs, or gallops. Respiratory: Normal respiratory effort without tachypnea nor retractions. Breath sounds are clear and equal bilaterally. No wheezes/rales/rhonchi. Gastrointestinal: Soft and non tender. No rebound. No guarding.  Genitourinary: Deferred Musculoskeletal: Normal range of motion in all extremities. No lower extremity edema. Neurologic:  Normal speech and language. No gross focal neurologic deficits are appreciated.  Skin:  Lacerations to right face. Roughly 3 cm vertical laceration to right forehead, two small 1 cm lacerations to right forehead, 2 cm laceration to right eyebrow, 1 cm to right upper lip.  Psychiatric: Mood and affect are normal. Speech and behavior are normal. Patient exhibits appropriate insight and judgment.  ____________________________________________    LABS (pertinent positives/negatives)  None  ____________________________________________   EKG  None  ____________________________________________    RADIOLOGY  CT head/cervical spine/max face No acute intracranial injury. No acute osseous injury. Fractured teeth, and concern for left incisor injury  ____________________________________________   PROCEDURES  Procedures  LACERATION REPAIR Performed by: Phineas Semen Authorized by: Phineas Semen Consent: Verbal consent obtained. Risks and benefits: risks, benefits and alternatives were discussed Consent given by: patient Patient identity confirmed: provided demographic data Prepped and Draped in normal sterile fashion Wound explored  Laceration Location: right face, multiple lacerations  Total Laceration Length: 8 cm  No Foreign  Bodies seen or palpated  Anesthesia: local infiltration  Local anesthetic: bupivacaine 0.5%  Anesthetic total: 5 ml  Irrigation method: syringe Amount of cleaning: standard  Skin closure: 5-0 vicryl rapide  Number of sutures: 25  Technique: simple interrupted  Patient tolerance: Patient tolerated the procedure well with no immediate complications.  ____________________________________________   INITIAL IMPRESSION / ASSESSMENT AND PLAN / ED COURSE  Pertinent labs & imaging results that were available during my care of the patient were reviewed by me and considered in my medical decision making (see chart for details).   Patient presented to the emergency department today because of concerns for injury.  Patient did have a chain snapped back and hit him in the right side of his face.  On exam patient has multiple lacerations to the right face.  Did obtain CT head max face and cervical spine likely none of which showed significant injury.  Patient does state he is up-to-date on his tetanus.  Staining of his right cornea did not show any ulcer or defect.  Patient's lacerations were closed.  Did discuss infection precautions with the patient.  Additionally has multiple fractured teeth.  He states he already has appointment with dentist on Monday.  We also discussed  pain control.  Did discuss concerns for possible addiction with pain medication.  However given multiple dental injuries as well as lacerations patient felt comfortable with small course of narcotic pain meds.  ___________________________________________   FINAL CLINICAL IMPRESSION(S) / ED DIAGNOSES  Final diagnoses:  Injury of head, initial encounter  Dental injury, initial encounter  Facial laceration, initial encounter     Note: This dictation was prepared with Dragon dictation. Any transcriptional errors that result from this process are unintentional     Phineas Semen, MD 09/30/19 2325

## 2019-09-30 NOTE — ED Notes (Signed)
This RN confirmed with pts supervisor with registration at bedside to witness that no testing is needed for pt to file workers comp.

## 2019-10-01 MED ORDER — ONDANSETRON 4 MG PO TBDP: 4.0000 mg | ORAL_TABLET | Freq: Once | ORAL | Status: AC

## 2019-10-01 MED ORDER — BUPIVACAINE HCL 0.5 % IJ SOLN
50.0000 mL | Freq: Once | INTRAMUSCULAR | Status: AC
  Administered 2019-10-01: 50 mL

## 2019-10-01 MED ORDER — TETRACAINE HCL 0.5 % OP SOLN
1.0000 [drp] | Freq: Once | OPHTHALMIC | Status: AC
  Administered 2019-10-01: 2 [drp] via OPHTHALMIC

## 2019-10-01 MED ORDER — FLUORESCEIN SODIUM 1 MG OP STRP: 1.0000 | ORAL_STRIP | Freq: Once | OPHTHALMIC | Status: AC

## 2019-10-17 ENCOUNTER — Ambulatory Visit: Payer: No Typology Code available for payment source | Admitting: Surgery

## 2019-10-19 ENCOUNTER — Other Ambulatory Visit: Payer: Self-pay

## 2019-10-19 ENCOUNTER — Ambulatory Visit (INDEPENDENT_AMBULATORY_CARE_PROVIDER_SITE_OTHER): Payer: No Typology Code available for payment source | Admitting: Surgery

## 2019-10-19 ENCOUNTER — Encounter: Payer: Self-pay | Admitting: Surgery

## 2019-10-19 DIAGNOSIS — R1033 Periumbilical pain: Secondary | ICD-10-CM

## 2019-10-19 NOTE — Patient Instructions (Addendum)
We will get you set up for a CT scan of your abdomen.   You are scheduled for a CT scan at [facility] on Friday July 9th at 8:00 am. You will need to arrive there by 7:45 am and have nothing to eat or drink for 4 hours prior. You will need to pick up a prep kit.   We will have you follow up in 2-3 weeks unless the scan is normal. We will call with your results.

## 2019-10-19 NOTE — Progress Notes (Signed)
10/19/2019  History of Present Illness: Mark Cuevas is a 59 y.o. male following up for abdominal pain.  He is s/p umbilical hernia repair with mesh on 02/26/2018 with Dr. Earlene Plater.  He had some post-op issues with firmness at the incision site, but otherwise did well post-op.  I saw him back in 01/2019 with intermittent episodes of fullness and tightness around the incision depending on his activity level.  His abdominal exam was benign without evidence of hernia recurrence.  Patient now reports that he's starting to feel a ripping sensation at the midline at the umbilicus.  Denies any bulging, but reports that the discomfort has worsened and is worse with activity at work, where he does strenuous activity.  He reports that when he coughs, if he pushes on his abdomen with his hand, he discomfort is less.  Of note, he was recently seen in the ED with multiple facial lacerations due to a work injury, which required multiple stitches.  He is healing well from that.  Past Medical History: Past Medical History:  Diagnosis Date  . COPD (chronic obstructive pulmonary disease) (HCC)   . GERD (gastroesophageal reflux disease)   . History of methicillin resistant staphylococcus aureus (MRSA)   . Hypertension   . Myocardial infarction (HCC)    2014  . Umbilical hernia without obstruction and without gangrene 01/19/2018     Past Surgical History: Past Surgical History:  Procedure Laterality Date  . CORONARY ANGIOPLASTY     2 stents  . CORONARY STENT PLACEMENT  2014  . UMBILICAL HERNIA REPAIR N/A 02/26/2018   Procedure: HERNIA REPAIR UMBILICAL ADULT WITH MESH;  Surgeon: Ancil Linsey, MD;  Location: [facility] ORS;  Service: General;  Laterality: N/A;    Home Medications: Prior to Admission medications   Medication Sig Start Date End Date Taking? Authorizing Provider  albuterol (PROVENTIL HFA;VENTOLIN HFA) 108 (90 Base) MCG/ACT inhaler Inhale 1-2 puffs into the lungs every 4 (four) hours as needed for  wheezing or shortness of breath.  12/25/17  Yes [provider]  aspirin EC 81 MG tablet Take 81 mg by mouth daily.   Yes [provider]  carvedilol (COREG) 3.125 MG tablet Take 3.125 mg by mouth 2 (two) times daily. 12/25/17  Yes [provider]  D3-50 1.25 MG (50000 UT) capsule Take 50,000 Units by mouth once a week. 06/14/19  Yes [provider]  pantoprazole (PROTONIX) 40 MG tablet Take 40 mg by mouth daily. 09/09/19  Yes [provider]  rosuvastatin (CRESTOR) 40 MG tablet Take 40 mg by mouth daily. 09/09/19  Yes [provider]  TRELEGY ELLIPTA 100-62.5-25 MCG/INH AEPB Inhale 1 puff into the lungs daily. 09/19/19  Yes [provider]    Allergies: No Known Allergies  Review of Systems: Review of Systems  Constitutional: Negative for chills and fever.  Respiratory: Negative for shortness of breath.   Cardiovascular: Negative for chest pain.  Gastrointestinal: Positive for abdominal pain. Negative for constipation, diarrhea, nausea and vomiting.    Physical Exam There were no vitals taken for this visit. CONSTITUTIONAL: No acute distress HEENT:  Normocephalic, atraumatic, extraocular motion intact. RESPIRATORY:  Lungs are clear, and breath sounds are equal bilaterally. Normal respiratory effort without pathologic use of accessory muscles. CARDIOVASCULAR: Heart is regular without murmurs, gallops, or rubs. GI: The abdomen is soft, non-distended, with some discomfort to deep palpation at the umbilicus.  Incision is well healed.  There is no bulging when the patient tenses his abdominal wall, but  there is a small linear defect palpable at the umbilicus.  Unclear if this is separation of the rectus muscles, vs post-surgical changes.  I do not feel anything bulging, and otherwise there no other issues with the incision area.  NEUROLOGIC:  Motor and sensation is grossly normal.  Cranial nerves are grossly intact. PSYCH:  Alert and  oriented to person, place and time. Affect is normal.   Assessment and Plan: This is a 59 y.o. male s/p umbilical hernia repair with mesh on 02/26/2018, with worsening ripping sensation at the umbilicus.  --Discussed with the patient that I do not feel a true hernia defect or anything bulging, but there is an area of concern on my exam at the midline.  Unclear if that is a hernia, though less likely.  As a precaution and to better evaluate this, will order CT scan abdomen/pelvis.  Patient will follow up afterwards and I will call him with the results.  Patient is in agreement with this plan.  Face-to-face time spent with the patient and care providers was 15 minutes, with more than 50% of the time spent counseling, educating, and coordinating care of the patient.     Howie Ill, MD [facility]

## 2019-10-28 ENCOUNTER — Ambulatory Visit
Admission: RE | Admit: 2019-10-28 | Discharge: 2019-10-28 | Disposition: A | Discharge status: Home / Self Care | Payer: 59 | Source: Ambulatory Visit | Attending: Surgery | Admitting: Surgery

## 2019-10-28 ENCOUNTER — Other Ambulatory Visit: Payer: Self-pay

## 2019-10-28 DIAGNOSIS — R1033 Periumbilical pain: Secondary | ICD-10-CM | POA: Diagnosis present

## 2019-10-28 LAB — POCT I-STAT CREATININE: Creatinine, Ser: 0.7 mg/dL (ref 0.61–1.24)

## 2019-10-28 MED ORDER — IOHEXOL 300 MG/ML  SOLN
85.0000 mL | Freq: Once | INTRAMUSCULAR | Status: AC | PRN
  Administered 2019-10-28: 85 mL via INTRAVENOUS

## 2019-10-31 ENCOUNTER — Telehealth: Payer: Self-pay

## 2019-10-31 DIAGNOSIS — K639 Disease of intestine, unspecified: Secondary | ICD-10-CM

## 2019-10-31 NOTE — Progress Notes (Signed)
CT scan personally viewed and results discussed with patient.  Area of prior umbilical hernia repair appears intact without any evidence of recurrence or other complication.  He does have an area of thickening along the sigmoid colon and along the stomach.  We'll set up GI referral for further evaluation.  Henrene Dodge, MD

## 2019-10-31 NOTE — Telephone Encounter (Signed)
Per Dr.Piscoya OK to cancel patient appointment for 7/14, along with sending referral for [facility] for thickening of sigmoid colon shown on recent CT scan on 07/09. Patient verbalized understanding and has no further questions.

## 2019-10-31 NOTE — Telephone Encounter (Signed)
-----   Message from Henrene Dodge, MD sent at 10/31/2019  4:16 PM EDT ----- Regarding: referral to GI [facility],  I saw this patient on 6/30 and had a CT scan on 7/9.  I called the patient this afternoon and discussed results with him.  The area of the umbilical hernia repair is intact and there is no evidence of any recurrence or complication.  However, the CT scan does show an area of thickening along the sigmoid colon as well as along the stomach.  Can y'all please set up a referral with GI for this?  He's in agreement to see them.  Also, he has an appointment with me on 7/14 that can be cancelled.  He's ok with that as well.   Thanks!  [employer]

## 2019-11-02 ENCOUNTER — Ambulatory Visit: Payer: No Typology Code available for payment source | Admitting: Surgery

## 2019-12-07 ENCOUNTER — Ambulatory Visit: Payer: No Typology Code available for payment source | Admitting: Gastroenterology

## 2019-12-07 ENCOUNTER — Encounter: Payer: Self-pay | Admitting: *Deleted
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
## 2023-04-21 ENCOUNTER — Ambulatory Visit
Admission: EM | Admit: 2023-04-21 | Discharge: 2023-04-21 | Disposition: A | Discharge status: Home / Self Care | Payer: Non-veteran care | Attending: Emergency Medicine | Admitting: Emergency Medicine

## 2023-04-21 DIAGNOSIS — J441 Chronic obstructive pulmonary disease with (acute) exacerbation: Secondary | ICD-10-CM | POA: Diagnosis not present

## 2023-04-21 DIAGNOSIS — J101 Influenza due to other identified influenza virus with other respiratory manifestations: Secondary | ICD-10-CM | POA: Diagnosis not present

## 2023-04-21 LAB — POC COVID19/FLU A&B COMBO
Covid Antigen, POC: NEGATIVE
Influenza A Antigen, POC: POSITIVE — AB
Influenza B Antigen, POC: NEGATIVE

## 2023-04-21 MED ORDER — OSELTAMIVIR PHOSPHATE 75 MG PO CAPS: 75.0000 mg | ORAL_CAPSULE | Freq: Two times a day (BID) | ORAL | 0 refills | Status: AC

## 2023-04-21 MED ORDER — PREDNISONE 20 MG PO TABS: 40.0000 mg | ORAL_TABLET | Freq: Every day | ORAL | 0 refills | Status: AC

## 2023-04-21 MED ORDER — PROMETHAZINE-DM 6.25-15 MG/5ML PO SYRP: 5.0000 mL | ORAL_SOLUTION | Freq: Every evening | ORAL | 0 refills | Status: AC | PRN

## 2023-04-21 MED ORDER — BENZONATATE 100 MG PO CAPS: 100.0000 mg | ORAL_CAPSULE | Freq: Three times a day (TID) | ORAL | 0 refills | Status: AC

## 2023-04-21 NOTE — ED Triage Notes (Signed)
 Patient to Urgent Care with complaints of sinuses, body aches, nasal and chest congestion, chills and sweats, shortness of breath, poor appetite.  Symptoms started Sunday- felt worse yesterday. Denies any known fevers.  Meds: Sudafed/ alker-seltzer plus.

## 2023-04-21 NOTE — Discharge Instructions (Addendum)
 Positive for influenza commonly known as the flu, this is a  virus and should steadily improve in time it can take up to 7 to 10 days before you truly start to see a turnaround however things will get better  Begin Tamiflu  every morning and every evening for 5 days to reduce the amount of virus in the body which should help to minimize symptoms but does not fully take away illness  Begin prednisone  every morning with food for 5 days to open and relax the airway, should settle shortness of breath virus is most likely flaring your COPD  You may use Tessalon  pill every 8 hours as needed for cough, may use cough syrup at bedtime if needed for rest  For wheezing or shortness of breath you may use albuterol inhaler every 4-6 hours as needed    You can take Tylenol  and/or Ibuprofen as needed for fever reduction and pain relief.   For cough: honey 1/2 to 1 teaspoon (you can dilute the honey in water or another fluid).  You can also use guaifenesin and dextromethorphan for cough. You can use a humidifier for chest congestion and cough.  If you don't have a humidifier, you can sit in the bathroom with the hot shower running.      For sore throat: try warm salt water gargles, cepacol lozenges, throat spray, warm tea or water with lemon/honey, popsicles or ice, or OTC cold relief medicine for throat discomfort.   For congestion: take a daily anti-histamine like Zyrtec, Claritin, and a oral decongestant, such as pseudoephedrine.  You can also use Flonase 1-2 sprays in each nostril daily.   It is important to stay hydrated: drink plenty of fluids (water, gatorade/powerade/pedialyte, juices, or teas) to keep your throat moisturized and help further relieve irritation/discomfort.

## 2023-04-21 NOTE — ED Provider Notes (Signed)
 [facility]    CSN: [id number] Arrival date & time: 04/21/23  1041      History   Chief Complaint Chief Complaint  Patient presents with   URI    HPI Odilon Cass is a 62 y.o. male.   Patient presents for evaluation of nasal congestion, rhinorrhea, nonproductive cough, shortness of breath at rest, sore throat, chills and intermittent generalized headache present for 2 days.  Known sick contact.  Poor appetite but able to tolerate some food and liquids.  Has attempted use of Alka-Seltzer and pseudoephedrine.  History of COPD, daily tobacco use.  Past Medical History:  Diagnosis Date   COPD (chronic obstructive pulmonary disease) (HCC)    GERD (gastroesophageal reflux disease)    History of methicillin resistant staphylococcus aureus (MRSA)    Hypertension    Myocardial infarction [facility])    2014   Umbilical hernia without obstruction and without gangrene 01/19/2018    Patient Active Problem List   Diagnosis Date Noted   Umbilical hernia without obstruction and without gangrene 01/19/2018    Past Surgical History:  Procedure Laterality Date   CORONARY ANGIOPLASTY     2 stents   CORONARY STENT PLACEMENT  2014   UMBILICAL HERNIA REPAIR N/A 02/26/2018   Procedure: HERNIA REPAIR UMBILICAL ADULT WITH MESH;  Surgeon: Nicholaus Selinda Birmingham, MD;  Location: [facility] ORS;  Service: General;  Laterality: N/A;       Home Medications    Prior to Admission medications   Medication Sig Start Date End Date Taking? Authorizing Provider  benzonatate  (TESSALON ) 100 MG capsule Take 1 capsule (100 mg total) by mouth every 8 (eight) hours. 04/21/23  Yes Jaely Silman R, NP  oseltamivir  (TAMIFLU ) 75 MG capsule Take 1 capsule (75 mg total) by mouth every 12 (twelve) hours. 04/21/23  Yes Jaquavius Hudler R, NP  predniSONE  (DELTASONE ) 20 MG tablet Take 2 tablets (40 mg total) by mouth daily. 04/21/23  Yes Gennette Shadix, Shelba SAUNDERS, NP  promethazine -dextromethorphan (PROMETHAZINE -DM) 6.25-15  MG/5ML syrup Take 5 mLs by mouth at bedtime as needed for cough. 04/21/23  Yes Brennan Karam R, NP  albuterol (PROVENTIL HFA;VENTOLIN HFA) 108 (90 Base) MCG/ACT inhaler Inhale 1-2 puffs into the lungs every 4 (four) hours as needed for wheezing or shortness of breath.  12/25/17   [provider]  aspirin EC 81 MG tablet Take 81 mg by mouth daily.    [provider]  carvedilol (COREG) 3.125 MG tablet Take 3.125 mg by mouth 2 (two) times daily. 12/25/17   [provider]  D3-50 1.25 MG (50000 UT) capsule Take 50,000 Units by mouth once a week. 06/14/19   [provider]  pantoprazole (PROTONIX) 40 MG tablet Take 40 mg by mouth daily. 09/09/19   [provider]  rosuvastatin (CRESTOR) 40 MG tablet Take 40 mg by mouth daily. 09/09/19   [provider]  TRELEGY ELLIPTA 100-62.5-25 MCG/INH AEPB Inhale 1 puff into the lungs daily. 09/19/19   [provider]    Family History Family History  Problem Relation Age of Onset   Esophageal cancer Mother    Kidney disease Mother    Heart disease Father    Prostate cancer Brother    Prostate cancer Maternal Grandfather     Social History Social History   Tobacco Use   Smoking status: Every Day    Current packs/day: 1.00    Average packs/day: 1 pack/day for 40.0 years (40.0 ttl pk-yrs)    Types: Cigarettes  Smokeless tobacco: Never  Vaping Use   Vaping status: Never Used  Substance Use Topics   Alcohol use: Never   Drug use: Never     Allergies   Patient has no known allergies.   Review of Systems Review of Systems   Physical Exam Triage Vital Signs ED Triage Vitals  Encounter Vitals Group     BP 04/21/23 1241 124/79     Systolic BP Percentile --      Diastolic BP Percentile --      Pulse Rate 04/21/23 1241 91     Resp 04/21/23 1241 18     Temp 04/21/23 1241 98.8 F (37.1 C)     Temp src --      SpO2 04/21/23 1241 95 %     Weight --      Height --      Head  Circumference --      Peak Flow --      Pain Score 04/21/23 1233 8     Pain Loc --      Pain Education --      Exclude from Growth Chart --    No data found.  Updated Vital Signs BP 124/79   Pulse 91   Temp 98.8 F (37.1 C)   Resp 18   SpO2 95%   Visual Acuity Right Eye Distance:   Left Eye Distance:   Bilateral Distance:    Right Eye Near:   Left Eye Near:    Bilateral Near:     Physical Exam Constitutional:      Appearance: Normal appearance.  HENT:     Head: Normocephalic.     Right Ear: Tympanic membrane, ear canal and external ear normal.     Left Ear: Tympanic membrane, ear canal and external ear normal.     Nose: Congestion present. No rhinorrhea.     Mouth/Throat:     Pharynx: Posterior oropharyngeal erythema present. No oropharyngeal exudate.  Eyes:     Extraocular Movements: Extraocular movements intact.  Cardiovascular:     Rate and Rhythm: Normal rate and regular rhythm.     Pulses: Normal pulses.     Heart sounds: Normal heart sounds.  Pulmonary:     Effort: Pulmonary effort is normal.     Comments: Wheezing present to the bilateral upper lobes, lower lobes are clear Musculoskeletal:     Cervical back: Normal range of motion and neck supple.  Neurological:     Mental Status: He is alert and oriented to person, place, and time. Mental status is at baseline.      UC Treatments / Results  Labs (all labs ordered are listed, but only abnormal results are displayed) Labs Reviewed  POC COVID19/FLU A&B COMBO - Abnormal; Notable for the following components:      Result Value   Influenza A Antigen, POC Positive (*)    All other components within normal limits    EKG   Radiology No results found.  Procedures Procedures (including critical care time)  Medications Ordered in UC Medications - No data to display  Initial Impression / Assessment and Plan / UC Course  I have reviewed the triage vital signs and the nursing notes.  Pertinent  labs & imaging results that were available during my care of the patient were reviewed by me and considered in my medical decision making (see chart for details).  Influenza A, COPD exacerbation  Patient is in no signs of distress nor toxic appearing.  Vital  signs are stable.  Low suspicion for pneumonia, pneumothorax or bronchitis and therefore will defer imaging.  Influenza A positive, negative for COVID, discussed findings.  Viral illness most likely flaring COPD as patient is experiencing shortness of breath.  Prescribed Tamiflu , prednisone , Tessalon  and Promethazine  DM.May use additional over-the-counter medications as needed for supportive care.  May follow-up with urgent care as needed if symptoms persist or worsen.  Final Clinical Impressions(s) / UC Diagnoses   Final diagnoses:  Influenza A  COPD exacerbation (HCC)     Discharge Instructions      Positive for influenza commonly known as the flu, this is a  virus and should steadily improve in time it can take up to 7 to 10 days before you truly start to see a turnaround however things will get better  Begin Tamiflu  every morning and every evening for 5 days to reduce the amount of virus in the body which should help to minimize symptoms but does not fully take away illness  Begin prednisone  every morning with food for 5 days to open and relax the airway, should settle shortness of breath virus is most likely flaring your COPD  You may use Tessalon  pill every 8 hours as needed for cough, may use cough syrup at bedtime if needed for rest  For wheezing or shortness of breath you may use albuterol inhaler every 4-6 hours as needed    You can take Tylenol  and/or Ibuprofen as needed for fever reduction and pain relief.   For cough: honey 1/2 to 1 teaspoon (you can dilute the honey in water or another fluid).  You can also use guaifenesin and dextromethorphan for cough. You can use a humidifier for chest congestion and cough.  If you  don't have a humidifier, you can sit in the bathroom with the hot shower running.      For sore throat: try warm salt water gargles, cepacol lozenges, throat spray, warm tea or water with lemon/honey, popsicles or ice, or OTC cold relief medicine for throat discomfort.   For congestion: take a daily anti-histamine like Zyrtec, Claritin, and a oral decongestant, such as pseudoephedrine.  You can also use Flonase 1-2 sprays in each nostril daily.   It is important to stay hydrated: drink plenty of fluids (water, gatorade/powerade/pedialyte, juices, or teas) to keep your throat moisturized and help further relieve irritation/discomfort.    ED Prescriptions     Medication Sig Dispense Auth. Provider   oseltamivir  (TAMIFLU ) 75 MG capsule Take 1 capsule (75 mg total) by mouth every 12 (twelve) hours. 10 capsule Aniruddh Ciavarella R, NP   predniSONE  (DELTASONE ) 20 MG tablet Take 2 tablets (40 mg total) by mouth daily. 10 tablet Katera Rybka R, NP   benzonatate  (TESSALON ) 100 MG capsule Take 1 capsule (100 mg total) by mouth every 8 (eight) hours. 21 capsule Janyth Riera R, NP   promethazine -dextromethorphan (PROMETHAZINE -DM) 6.25-15 MG/5ML syrup Take 5 mLs by mouth at bedtime as needed for cough. 118 mL Reshaun Briseno, Shelba SAUNDERS, NP      PDMP not reviewed this encounter.   Teresa Shelba SAUNDERS, NP 04/21/23 1451

## 2023-06-25 ENCOUNTER — Ambulatory Visit
Admission: EM | Admit: 2023-06-25 | Discharge: 2023-06-25 | Disposition: A | Discharge status: Home / Self Care | Attending: Emergency Medicine | Admitting: Emergency Medicine

## 2023-06-25 ENCOUNTER — Encounter: Payer: Self-pay | Admitting: Emergency Medicine

## 2023-06-25 ENCOUNTER — Ambulatory Visit

## 2023-06-25 ENCOUNTER — Other Ambulatory Visit: Payer: Self-pay

## 2023-06-25 DIAGNOSIS — S6992XA Unspecified injury of left wrist, hand and finger(s), initial encounter: Secondary | ICD-10-CM | POA: Diagnosis not present

## 2023-06-25 NOTE — Discharge Instructions (Signed)
 On x-ray able to visualize a break in the finger therefore I am and able to close your wound today  Have scheduled you appointments for tomorrow at [facility] orthopedics with Dr. Kathaleen Grinder at 10: 00 AM  Has been thoroughly cleansed by nursing staff and covered with a nonadherent dressing, please leave in place until seen tomorrow

## 2023-06-25 NOTE — ED Triage Notes (Addendum)
 Patient presents to [facility] for evaluation of laceration to his right pointer finger at approx 1130 this morning.  He caught it in a pulley, he describes it as "having cut my finger off".  Patient is still leaking in room, but bleeding is minimal with bandaid over it.    Last tetanus - 12/21/2019

## 2023-06-25 NOTE — ED Provider Notes (Signed)
 Mark Cuevas    CSN: [id number] Arrival date & time: 06/25/23  1405      History   Chief Complaint Chief Complaint  Patient presents with   Laceration    HPI Mark Cuevas is a 63 y.o. male.   Patient presents for evaluation of a laceration to the left index finger that occurred this morning around 11:30 AM.  Finger got caught in a pulley as it was turning causing laceration to the finger and smashing the finger.  Bleeding currently.  Has covered with bandage.  Last tetanus 2021.  To fully move the finger.  Denies numbness or tingling.  Past Medical History:  Diagnosis Date   COPD (chronic obstructive pulmonary disease) (HCC)    GERD (gastroesophageal reflux disease)    History of methicillin resistant staphylococcus aureus (MRSA)    Hypertension    Myocardial infarction [facility])    2014   Umbilical hernia without obstruction and without gangrene 01/19/2018    Patient Active Problem List   Diagnosis Date Noted   Umbilical hernia without obstruction and without gangrene 01/19/2018    Past Surgical History:  Procedure Laterality Date   CORONARY ANGIOPLASTY     2 stents   CORONARY STENT PLACEMENT  2014   UMBILICAL HERNIA REPAIR N/A 02/26/2018   Procedure: HERNIA REPAIR UMBILICAL ADULT WITH MESH;  Surgeon: Ancil Linsey, MD;  Location: [facility] ORS;  Service: General;  Laterality: N/A;       Home Medications    Prior to Admission medications   Medication Sig Start Date End Date Taking? Authorizing Provider  albuterol (PROVENTIL HFA;VENTOLIN HFA) 108 (90 Base) MCG/ACT inhaler Inhale 1-2 puffs into the lungs every 4 (four) hours as needed for wheezing or shortness of breath.  12/25/17   [provider]  aspirin EC 81 MG tablet Take 81 mg by mouth daily.    [provider]  benzonatate (TESSALON) 100 MG capsule Take 1 capsule (100 mg total) by mouth every 8 (eight) hours. 04/21/23   [place] Current, Elita Boone, NP  carvedilol (COREG) 3.125 MG tablet Take 3.125  mg by mouth 2 (two) times daily. 12/25/17   [provider]  D3-50 1.25 MG (50000 UT) capsule Take 50,000 Units by mouth once a week. 06/14/19   [provider]  oseltamivir (TAMIFLU) 75 MG capsule Take 1 capsule (75 mg total) by mouth every 12 (twelve) hours. 04/21/23   Amaal Dimartino, Elita Boone, NP  pantoprazole (PROTONIX) 40 MG tablet Take 40 mg by mouth daily. 09/09/19   [provider]  predniSONE (DELTASONE) 20 MG tablet Take 2 tablets (40 mg total) by mouth daily. 04/21/23   Valinda Hoar, NP  promethazine-dextromethorphan (PROMETHAZINE-DM) 6.25-15 MG/5ML syrup Take 5 mLs by mouth at bedtime as needed for cough. 04/21/23   Climmie Cronce, Elita Boone, NP  rosuvastatin (CRESTOR) 40 MG tablet Take 40 mg by mouth daily. 09/09/19   [provider]  TRELEGY ELLIPTA 100-62.5-25 MCG/INH AEPB Inhale 1 puff into the lungs daily. 09/19/19   [provider]    Family History Family History  Problem Relation Age of Onset   Esophageal cancer Mother    Kidney disease Mother    Heart disease Father    Prostate cancer Brother    Prostate cancer Maternal Grandfather     Social History Social History   Tobacco Use   Smoking status: Every Day    Current packs/day: 1.00    Average packs/day: 1 pack/day for 40.0 years (40.0 ttl pk-yrs)  Types: Cigarettes   Smokeless tobacco: Never  Vaping Use   Vaping status: Never Used  Substance Use Topics   Alcohol use: Never   Drug use: Never     Allergies   Patient has no known allergies.   Review of Systems Review of Systems   Physical Exam Triage Vital Signs ED Triage Vitals [06/25/23 1503]  Encounter Vitals Group     BP 135/81     Systolic BP Percentile      Diastolic BP Percentile      Pulse Rate 60     Resp 16     Temp 97.6 F (36.4 C)     Temp Source Temporal     SpO2 96 %     Weight      Height      Head Circumference      Peak Flow      Pain Score      Pain Loc      Pain Education      Exclude  from Growth Chart    No data found.  Updated Vital Signs BP 135/81 (BP Location: Left Arm)   Pulse 60   Temp 97.6 F (36.4 C) (Temporal)   Resp 16   SpO2 96%   Visual Acuity Right Eye Distance:   Left Eye Distance:   Bilateral Distance:    Right Eye Near:   Left Eye Near:    Bilateral Near:     Physical Exam Constitutional:      Appearance: Normal appearance.  Eyes:     Extraocular Movements: Extraocular movements intact.  Pulmonary:     Effort: Pulmonary effort is normal.  Skin:    Comments: Approximately 1.5 x 2 x 2 laceration present to the palmar aspect of the distal phalanx of the left index finger, overlying the DIP joint but no involvement, able to complete range of motion, sensation intact, bleeding  Neurological:     Mental Status: He is alert and oriented to person, place, and time. Mental status is at baseline.      UC Treatments / Results  Labs (all labs ordered are listed, but only abnormal results are displayed) Labs Reviewed - No data to display  EKG   Radiology No results found.  Procedures Procedures (including critical care time)  Medications Ordered in UC Medications - No data to display  Initial Impression / Assessment and Plan / UC Course  I have reviewed the triage vital signs and the nursing notes.  Pertinent labs & imaging results that were available during my care of the patient were reviewed by me and considered in my medical decision making (see chart for details).  Left index finger, initial encounter  X-ray pending, able to visualize fracture, did discuss with radiology group, and formal reading completed to confirm fracture, notified on-call orthopedic doctor at [facility] clinic, schedule follow-up appointment for tomorrow, wound thoroughly cleansed by nursing staff with Betadine and saline and then covered with a nonadherent dressing, patient in agreement with plan Final Clinical Impressions(s) / UC Diagnoses   Final  diagnoses:  Injury of left index finger, initial encounter     Discharge Instructions      On x-ray able to visualize a break in the finger therefore I am and able to close your wound today  Have scheduled you appointments for tomorrow at [facility] orthopedics with Dr. Kathaleen Grinder at 10: 00 AM  Has been thoroughly cleansed by nursing staff and covered with a nonadherent dressing, please  leave in place until seen tomorrow       ED Prescriptions   None    PDMP not reviewed this encounter.   Valinda Hoar, [place] 06/25/23 [phone]

## 2023-12-14 ENCOUNTER — Other Ambulatory Visit: Payer: Self-pay

## 2023-12-14 DIAGNOSIS — M79604 Pain in right leg: Secondary | ICD-10-CM

## 2023-12-21 ENCOUNTER — Emergency Department
Admission: EM | Admit: 2023-12-21 | Discharge: 2023-12-21 | Disposition: A | Discharge status: Home / Self Care | Attending: Emergency Medicine | Admitting: Emergency Medicine

## 2023-12-21 ENCOUNTER — Other Ambulatory Visit: Payer: Self-pay

## 2023-12-21 ENCOUNTER — Emergency Department

## 2023-12-21 DIAGNOSIS — J449 Chronic obstructive pulmonary disease, unspecified: Secondary | ICD-10-CM | POA: Insufficient documentation

## 2023-12-21 DIAGNOSIS — R0789 Other chest pain: Secondary | ICD-10-CM | POA: Diagnosis present

## 2023-12-21 DIAGNOSIS — I10 Essential (primary) hypertension: Secondary | ICD-10-CM | POA: Diagnosis not present

## 2023-12-21 DIAGNOSIS — Z955 Presence of coronary angioplasty implant and graft: Secondary | ICD-10-CM | POA: Insufficient documentation

## 2023-12-21 DIAGNOSIS — I2510 Atherosclerotic heart disease of native coronary artery without angina pectoris: Secondary | ICD-10-CM | POA: Insufficient documentation

## 2023-12-21 DIAGNOSIS — R079 Chest pain, unspecified: Secondary | ICD-10-CM

## 2023-12-21 LAB — BASIC METABOLIC PANEL WITH GFR
Anion gap: 8 (ref 5–15)
BUN: 12 mg/dL (ref 8–23)
CO2: 27 mmol/L (ref 22–32)
Calcium: 8.9 mg/dL (ref 8.9–10.3)
Chloride: 103 mmol/L (ref 98–111)
Creatinine, Ser: 0.95 mg/dL (ref 0.61–1.24)
GFR, Estimated: >60 mL/min (ref >60)
Glucose, Bld: 134 mg/dL — ABNORMAL HIGH (ref 70–99)
Potassium: 4.3 mmol/L (ref 3.5–5.1)
Sodium: 138 mmol/L (ref 135–145)

## 2023-12-21 LAB — TROPONIN I (HIGH SENSITIVITY): Troponin I (High Sensitivity): 3 ng/L (ref <18)

## 2023-12-21 LAB — CBC
HCT: 43.9 % (ref 39.0–52.0)
Hemoglobin: 14.9 g/dL (ref 13.0–17.0)
MCH: 29.8 pg (ref 26.0–34.0)
MCHC: 33.9 g/dL (ref 30.0–36.0)
MCV: 87.8 fL (ref 80.0–100.0)
Platelets: 261 K/uL (ref 150–400)
RBC: 5 MIL/uL (ref 4.22–5.81)
RDW: 13.8 % (ref 11.5–15.5)
WBC: 9 K/uL (ref 4.0–10.5)
nRBC: 0 % (ref 0.0–0.2)

## 2023-12-21 NOTE — ED Provider Notes (Signed)
 [facility] Provider Note    Event Date/Time   First MD Initiated Contact with Patient 12/21/23 1257     (approximate)  History   Chief Complaint: Chest Pain  HPI  Zyheir Daft is a 63 y.o. male with a past medical history of COPD, gastric reflux, hypertension, CAD status post MI in 2014 with 2 stents, follows up at the [place], presents to the emergency department for chest pain.  According to the patient over the last 2 to 3 days he has had a slight discomfort in the left chest, somewhat worse with certain movements of the way he sits.  Denies any worsening with deep breathing no cough or fever.  Given the patient's history of stents previously he wanted to be evaluated as a precaution.  Patient denies any pain currently.  Physical Exam   Triage Vital Signs: ED Triage Vitals  Encounter Vitals Group     BP 12/21/23 1218 128/73     Girls Systolic BP Percentile --      Girls Diastolic BP Percentile --      Boys Systolic BP Percentile --      Boys Diastolic BP Percentile --      Pulse Rate 12/21/23 1218 64     Resp 12/21/23 1218 16     Temp 12/21/23 1218 98.6 F (37 C)     Temp Source 12/21/23 1218 Oral     SpO2 12/21/23 1218 96 %     Weight 12/21/23 1219 160 lb (72.6 kg)     Height --      Head Circumference --      Peak Flow --      Pain Score 12/21/23 1216 4     Pain Loc --      Pain Education --      Exclude from Growth Chart --     Most recent vital signs: Vitals:   12/21/23 1218  BP: 128/73  Pulse: 64  Resp: 16  Temp: 98.6 F (37 C)  SpO2: 96%    General: Awake, no distress.  CV:  Good peripheral perfusion.  Regular rate and rhythm  Resp:  Normal effort.  Equal breath sounds bilaterally.  Abd:  No distention.  Soft, nontender.  No rebound or guarding. Other:  No lower extremity edema.   ED Results / Procedures / Treatments   EKG  I reviewed and interpreted the EKG which appears show normal sinus rhythm at 64 bpm with a narrow  QRS, normal axis, normal intervals, no concerning ST changes.  RADIOLOGY  I have reviewed interpreted chest x-ray images.  No consolidation on my evaluation. Radiology has read the x-ray as mild left lower lobe atelectasis for scarring.   MEDICATIONS ORDERED IN ED: Medications - No data to display   IMPRESSION / MDM / ASSESSMENT AND PLAN / ED COURSE  I reviewed the triage vital signs and the nursing notes.  Patient's presentation is most consistent with acute presentation with potential threat to life or bodily function.  Patient presents the emergency department for chest pain.  States slight intermittent pain to his left chest.  States it occurs mostly when he sits in certain ways.  Denies any chest pain currently.  No pleuritic pain.  Overall the patient appears well.  Reassuring physical exam, reassuring vital signs.  Patient's labs today are normal as well with a normal CBC chemistry and a negative troponin.  Chest x-ray is clear and EKG shows normal results.  Given the  patient's reassuring findings I believe the patient is safe for discharge home with outpatient follow-up.  I urged the patient to follow-up with his doctor at the [place] as he states he has not had any further cardiac history such as catheterizations or a stress test since 2014.  Patient agreeable to plan.  Provided my typical chest pain return precautions.  FINAL CLINICAL IMPRESSION(S) / ED DIAGNOSES   Chest pain  Note:  This document was prepared using Dragon voice recognition software and may include unintentional dictation errors.   Dorothyann Drivers, MD 12/21/23 1306

## 2023-12-21 NOTE — Discharge Instructions (Signed)
 Please follow-up with your doctor at the [facility] regarding your chest pain.  Return to the emergency department should your chest pain returns/worsen, develop any shortness of breath, or any other symptom personally concerning to yourself.

## 2023-12-21 NOTE — ED Triage Notes (Signed)
 Pt to ED via POV from home. Pt reports left sided CP that started Friday and has been intermittent. Pt reports MI in 2014. Denies SOB. Everyday smoker.

## 2024-02-11 ENCOUNTER — Ambulatory Visit

## 2024-05-05 ENCOUNTER — Encounter: Payer: Self-pay | Admitting: Emergency Medicine

## 2024-05-05 ENCOUNTER — Ambulatory Visit: Admission: EM | Admit: 2024-05-05 | Discharge: 2024-05-05 | Disposition: A | Discharge status: Home / Self Care

## 2024-05-05 DIAGNOSIS — M545 Low back pain, unspecified: Secondary | ICD-10-CM

## 2024-05-05 MED ORDER — IBUPROFEN 600 MG PO TABS: 600.0000 mg | ORAL_TABLET | Freq: Four times a day (QID) | ORAL | 0 refills | Status: AC | PRN

## 2024-05-05 MED ORDER — METHOCARBAMOL 500 MG PO TABS: 500.0000 mg | ORAL_TABLET | Freq: Two times a day (BID) | ORAL | 0 refills | Status: AC | PRN

## 2024-05-05 NOTE — ED Triage Notes (Addendum)
 Patient in office today complaint of Lower back pain x5d. Per patient unsured if he injured himself. Has had left over flexeril and Neurontin that hasn't been helping him  NUR:wnwz

## 2024-05-05 NOTE — Discharge Instructions (Signed)
Take ibuprofen as needed for discomfort.  Take the muscle relaxer as needed for muscle spasm; Do not drive, operate machinery, or drink alcohol with this medication as it can cause drowsiness.   Follow up with your primary care provider or an orthopedist if your symptoms are not improving.

## 2024-05-05 NOTE — ED Provider Notes (Signed)
 " CAY RALPH PELT    CSN: [id number] Arrival date & time: 05/05/24  1117      History   Chief Complaint Chief Complaint  Patient presents with   Back Pain    HPI Mark Cuevas is a 64 y.o. male.  Patient presents with 5-day history of right lower back pain.  No falls or injury.  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  The pain does not radiate.  It is constant and worse with sitting.  It improves when he is lying on his left side.  He has attempted treatment with cyclobenzaprine and gabapentin which were previously prescribed for him at the [facility] for other symptoms.  No OTC medications taken.  He denies numbness, weakness, paresthesias, saddle anesthesia, loss of bowel/bladder control, fever, chills, abdominal pain, dysuria, hematuria.  His medical history includes COPD, hypertension, MI.  The history is provided by the patient and medical records.    Past Medical History:  Diagnosis Date   COPD (chronic obstructive pulmonary disease) (HCC)    GERD (gastroesophageal reflux disease)    History of methicillin resistant staphylococcus aureus (MRSA)    Hypertension    Myocardial infarction [facility])    2014   Umbilical hernia without obstruction and without gangrene 01/19/2018    Patient Active Problem List   Diagnosis Date Noted   Umbilical hernia without obstruction and without gangrene 01/19/2018    Past Surgical History:  Procedure Laterality Date   CORONARY ANGIOPLASTY     2 stents   CORONARY STENT PLACEMENT  2014   UMBILICAL HERNIA REPAIR N/A 02/26/2018   Procedure: HERNIA REPAIR UMBILICAL ADULT WITH MESH;  Surgeon: Nicholaus Selinda Birmingham, MD;  Location: [facility] ORS;  Service: General;  Laterality: N/A;       Home Medications    Prior to Admission medications  Medication Sig Start Date End Date Taking? Authorizing Provider  gabapentin (NEURONTIN) 300 MG capsule Take 300 mg by mouth. 05/07/23  Yes [provider]  ibuprofen  (ADVIL ) 600 MG tablet Take 1 tablet (600  mg total) by mouth every 6 (six) hours as needed. 05/05/24  Yes Corlis Burnard DEL, NP  methocarbamol  (ROBAXIN ) 500 MG tablet Take 1 tablet (500 mg total) by mouth 2 (two) times daily as needed for muscle spasms. 05/05/24  Yes Corlis Burnard DEL, NP  albuterol (PROVENTIL HFA;VENTOLIN HFA) 108 (90 Base) MCG/ACT inhaler Inhale 1-2 puffs into the lungs every 4 (four) hours as needed for wheezing or shortness of breath.  12/25/17   [provider]  aspirin EC 81 MG tablet Take 81 mg by mouth daily.    [provider]  benzonatate  (TESSALON ) 100 MG capsule Take 1 capsule (100 mg total) by mouth every 8 (eight) hours. 04/21/23   White, Shelba SAUNDERS, NP  carvedilol (COREG) 3.125 MG tablet Take 3.125 mg by mouth 2 (two) times daily. 12/25/17   [provider]  D3-50 1.25 MG (50000 UT) capsule Take 50,000 Units by mouth once a week. 06/14/19   [provider]  oseltamivir  (TAMIFLU ) 75 MG capsule Take 1 capsule (75 mg total) by mouth every 12 (twelve) hours. 04/21/23   White, Shelba SAUNDERS, NP  pantoprazole (PROTONIX) 40 MG tablet Take 40 mg by mouth daily. 09/09/19   [provider]  predniSONE  (DELTASONE ) 20 MG tablet Take 2 tablets (40 mg total) by mouth daily. 04/21/23   Teresa Shelba SAUNDERS, NP  promethazine -dextromethorphan (PROMETHAZINE -DM) 6.25-15 MG/5ML syrup Take 5 mLs by mouth at bedtime as needed for  cough. 04/21/23   Teresa Shelba SAUNDERS, NP  rosuvastatin (CRESTOR) 40 MG tablet Take 40 mg by mouth daily. 09/09/19   [provider]  TRELEGY ELLIPTA 100-62.5-25 MCG/INH AEPB Inhale 1 puff into the lungs daily. 09/19/19   [provider]    Family History Family History  Problem Relation Age of Onset   Esophageal cancer Mother    Kidney disease Mother    Heart disease Father    Prostate cancer Brother    Prostate cancer Maternal Grandfather     Social History Social History[1]   Allergies   Patient has no known allergies.   Review of Systems Review of  Systems  Constitutional:  Negative for chills and fever.  Gastrointestinal:  Negative for abdominal pain, constipation, diarrhea, nausea and vomiting.  Genitourinary:  Negative for dysuria, flank pain and hematuria.  Musculoskeletal:  Positive for back pain. Negative for arthralgias, gait problem and joint swelling.  Skin:  Negative for color change, rash and wound.  Neurological:  Negative for weakness and numbness.     Physical Exam Triage Vital Signs ED Triage Vitals [05/05/24 1148]  Encounter Vitals Group     BP      Girls Systolic BP Percentile      Girls Diastolic BP Percentile      Boys Systolic BP Percentile      Boys Diastolic BP Percentile      Pulse      Resp      Temp      Temp src      SpO2      Weight 160 lb (72.6 kg)     Height 5' 7 (1.702 m)     Head Circumference      Peak Flow      Pain Score 8     Pain Loc      Pain Education      Exclude from Growth Chart    No data found.  Updated Vital Signs BP 127/71   Pulse 61   Temp 97.8 F (36.6 C)   Resp 16   Ht 5' 7 (1.702 m)   Wt 160 lb (72.6 kg)   SpO2 95%   BMI 25.06 kg/m   Visual Acuity Right Eye Distance:   Left Eye Distance:   Bilateral Distance:    Right Eye Near:   Left Eye Near:    Bilateral Near:     Physical Exam Constitutional:      General: He is not in acute distress. HENT:     Mouth/Throat:     Mouth: Mucous membranes are moist.  Cardiovascular:     Rate and Rhythm: Normal rate.  Pulmonary:     Effort: Pulmonary effort is normal. No respiratory distress.  Abdominal:     General: Bowel sounds are normal.     Palpations: Abdomen is soft.     Tenderness: There is no abdominal tenderness. There is no right CVA tenderness, left CVA tenderness, guarding or rebound.  Musculoskeletal:        General: No swelling, tenderness or deformity. Normal range of motion.  Skin:    General: Skin is warm and dry.     Capillary Refill: Capillary refill takes less than 2 seconds.      Findings: No bruising, erythema, lesion or rash.  Neurological:     General: No focal deficit present.     Mental Status: He is alert.     Sensory: No sensory deficit.     Motor:  No weakness.     Gait: Gait normal.      UC Treatments / Results  Labs (all labs ordered are listed, but only abnormal results are displayed) Labs Reviewed - No data to display  EKG   Radiology No results found.  Procedures Procedures (including critical care time)  Medications Ordered in UC Medications - No data to display  Initial Impression / Assessment and Plan / UC Course  I have reviewed the triage vital signs and the nursing notes.  Pertinent labs & imaging results that were available during my care of the patient were reviewed by me and considered in my medical decision making (see chart for details).    Right lower back pain without sciatica.  Afebrile and vital signs are stable.  No trauma.  Treating today with ibuprofen  and methocarbamol .  Precautions for drowsiness with methocarbamol  discussed.  Instructed patient to follow-up with his PCP or an orthopedist if he is not improving.  Contact information for on-call Ortho provided.  Education provided on back pain.  He agrees to plan of care.  Final Clinical Impressions(s) / UC Diagnoses   Final diagnoses:  Acute right-sided low back pain without sciatica     Discharge Instructions      Take ibuprofen  as needed for discomfort.    Take the muscle relaxer as needed for muscle spasm; Do not drive, operate machinery, or drink alcohol with this medication as it can cause drowsiness.   Follow up with your primary care provider or an orthopedist if your symptoms are not improving.         ED Prescriptions     Medication Sig Dispense Auth. Provider   methocarbamol  (ROBAXIN ) 500 MG tablet Take 1 tablet (500 mg total) by mouth 2 (two) times daily as needed for muscle spasms. 10 tablet Corlis Burnard DEL, NP   ibuprofen  (ADVIL ) 600 MG  tablet Take 1 tablet (600 mg total) by mouth every 6 (six) hours as needed. 20 tablet Corlis Burnard DEL, NP      I have reviewed the PDMP during this encounter.    [1]  Social History Tobacco Use   Smoking status: Every Day    Current packs/day: 1.00    Average packs/day: 1 pack/day for 40.0 years (40.0 ttl pk-yrs)    Types: Cigarettes   Smokeless tobacco: Never  Vaping Use   Vaping status: Never Used  Substance Use Topics   Alcohol use: Never   Drug use: Never     Corlis Burnard DEL, NP 05/05/24 1216  "
